# Patient Record
Sex: Male | Born: 1960 | Hispanic: Yes | Marital: Married | Smoking: Never smoker
Health system: Southern US, Community
[De-identification: ages and names within clinical notes are randomized; demographics above are authoritative.]

## PROBLEM LIST (undated history)

## (undated) DIAGNOSIS — S06305A Unspecified focal traumatic brain injury with loss of consciousness greater than 24 hours with return to pre-existing conscious level, initial encounter: Secondary | ICD-10-CM

## (undated) DIAGNOSIS — I10 Essential (primary) hypertension: Secondary | ICD-10-CM

---

## 2016-05-31 DIAGNOSIS — Z79899 Other long term (current) drug therapy: Secondary | ICD-10-CM | POA: Diagnosis not present

## 2016-05-31 DIAGNOSIS — S06369D Traumatic hemorrhage of cerebrum, unspecified, with loss of consciousness of unspecified duration, subsequent encounter: Secondary | ICD-10-CM | POA: Diagnosis not present

## 2016-05-31 DIAGNOSIS — G08 Intracranial and intraspinal phlebitis and thrombophlebitis: Secondary | ICD-10-CM | POA: Diagnosis not present

## 2016-05-31 DIAGNOSIS — S020XXD Fracture of vault of skull, subsequent encounter for fracture with routine healing: Secondary | ICD-10-CM | POA: Insufficient documentation

## 2016-05-31 DIAGNOSIS — W1800XD Striking against unspecified object with subsequent fall, subsequent encounter: Secondary | ICD-10-CM | POA: Diagnosis not present

## 2016-05-31 DIAGNOSIS — S0990XD Unspecified injury of head, subsequent encounter: Secondary | ICD-10-CM | POA: Diagnosis present

## 2016-05-31 DIAGNOSIS — I10 Essential (primary) hypertension: Secondary | ICD-10-CM | POA: Insufficient documentation

## 2016-06-01 ENCOUNTER — Emergency Department (HOSPITAL_BASED_OUTPATIENT_CLINIC_OR_DEPARTMENT_OTHER): Payer: Worker's Compensation

## 2016-06-01 ENCOUNTER — Encounter (HOSPITAL_BASED_OUTPATIENT_CLINIC_OR_DEPARTMENT_OTHER): Payer: Self-pay | Admitting: Emergency Medicine

## 2016-06-01 ENCOUNTER — Emergency Department (HOSPITAL_BASED_OUTPATIENT_CLINIC_OR_DEPARTMENT_OTHER)
Admission: EM | Admit: 2016-06-01 | Discharge: 2016-06-01 | Disposition: A | Discharge status: Other Acute Inpatient Hospital | Payer: Worker's Compensation | Attending: Emergency Medicine | Admitting: Emergency Medicine

## 2016-06-01 DIAGNOSIS — S06369D Traumatic hemorrhage of cerebrum, unspecified, with loss of consciousness of unspecified duration, subsequent encounter: Secondary | ICD-10-CM | POA: Diagnosis not present

## 2016-06-01 DIAGNOSIS — G08 Intracranial and intraspinal phlebitis and thrombophlebitis: Secondary | ICD-10-CM

## 2016-06-01 DIAGNOSIS — S020XXD Fracture of vault of skull, subsequent encounter for fracture with routine healing: Secondary | ICD-10-CM

## 2016-06-01 DIAGNOSIS — Z79899 Other long term (current) drug therapy: Secondary | ICD-10-CM | POA: Diagnosis not present

## 2016-06-01 DIAGNOSIS — W1800XD Striking against unspecified object with subsequent fall, subsequent encounter: Secondary | ICD-10-CM | POA: Diagnosis not present

## 2016-06-01 DIAGNOSIS — I619 Nontraumatic intracerebral hemorrhage, unspecified: Secondary | ICD-10-CM

## 2016-06-01 DIAGNOSIS — S0990XD Unspecified injury of head, subsequent encounter: Secondary | ICD-10-CM | POA: Diagnosis present

## 2016-06-01 DIAGNOSIS — S06309D Unspecified focal traumatic brain injury with loss of consciousness of unspecified duration, subsequent encounter: Secondary | ICD-10-CM

## 2016-06-01 DIAGNOSIS — I10 Essential (primary) hypertension: Secondary | ICD-10-CM | POA: Diagnosis not present

## 2016-06-01 HISTORY — DX: Essential (primary) hypertension: I10

## 2016-06-01 HISTORY — DX: Unspecified focal traumatic brain injury with loss of consciousness greater than 24 hours with return to pre-existing conscious level, initial encounter: S06.305A

## 2016-06-01 LAB — CBC WITH DIFFERENTIAL/PLATELET
BASOS ABS: 0 10*3/uL (ref 0.0–0.1)
BASOS PCT: 0 %
EOS ABS: 0.1 10*3/uL (ref 0.0–0.7)
Eosinophils Relative: 0 %
HCT: 42.3 % (ref 39.0–52.0)
HEMOGLOBIN: 14.9 g/dL (ref 13.0–17.0)
LYMPHS ABS: 2.4 10*3/uL (ref 0.7–4.0)
Lymphocytes Relative: 19 %
MCH: 30.2 pg (ref 26.0–34.0)
MCHC: 35.2 g/dL (ref 30.0–36.0)
MCV: 85.8 fL (ref 78.0–100.0)
Monocytes Absolute: 1.5 10*3/uL — ABNORMAL HIGH (ref 0.1–1.0)
Monocytes Relative: 12 %
NEUTROS PCT: 69 %
Neutro Abs: 8.5 10*3/uL — ABNORMAL HIGH (ref 1.7–7.7)
Platelets: 177 10*3/uL (ref 150–400)
RBC: 4.93 MIL/uL (ref 4.22–5.81)
RDW: 12.5 % (ref 11.5–15.5)
WBC: 12.4 10*3/uL — AB (ref 4.0–10.5)

## 2016-06-01 LAB — BASIC METABOLIC PANEL
Anion gap: 8 (ref 5–15)
BUN: 17 mg/dL (ref 6–20)
CO2: 27 mmol/L (ref 22–32)
CREATININE: 0.8 mg/dL (ref 0.61–1.24)
Calcium: 8.8 mg/dL — ABNORMAL LOW (ref 8.9–10.3)
Chloride: 99 mmol/L — ABNORMAL LOW (ref 101–111)
GFR calc non Af Amer: >60 mL/min (ref >60)
Glucose, Bld: 134 mg/dL — ABNORMAL HIGH (ref 65–99)
Potassium: 3.1 mmol/L — ABNORMAL LOW (ref 3.5–5.1)
SODIUM: 134 mmol/L — AB (ref 135–145)

## 2016-06-01 MED ORDER — ACETAMINOPHEN 325 MG PO TABS
650.0000 mg | ORAL_TABLET | Freq: Once | ORAL | Status: AC
  Administered 2016-06-01: 650 mg via ORAL
  Filled 2016-06-01: qty 2

## 2016-06-01 MED ORDER — FENTANYL CITRATE (PF) 100 MCG/2ML IJ SOLN
100.0000 ug | Freq: Once | INTRAMUSCULAR | Status: AC
  Administered 2016-06-01: 100 ug via INTRAVENOUS
  Filled 2016-06-01: qty 2

## 2016-06-01 MED ORDER — IOPAMIDOL (ISOVUE-300) INJECTION 61%
100.0000 mL | Freq: Once | INTRAVENOUS | Status: AC | PRN
  Administered 2016-06-01: 100 mL via INTRAVENOUS

## 2016-06-01 MED ORDER — ONDANSETRON HCL 4 MG/2ML IJ SOLN
4.0000 mg | Freq: Once | INTRAMUSCULAR | Status: AC
  Administered 2016-06-01: 4 mg via INTRAVENOUS
  Filled 2016-06-01: qty 2

## 2016-06-01 NOTE — ED Notes (Signed)
Pt presents to ED with complaints of new HA. Pt was at [facility] and discharged yesterday with skull fracture and head bleed. Pt complaints of HA unrelieved by Fioricet.

## 2016-06-01 NOTE — ED Notes (Signed)
Patient transported to CT again. 

## 2016-06-01 NOTE — ED Notes (Signed)
EDP into room 

## 2016-06-01 NOTE — ED Notes (Signed)
Pt assisted to stand at bedside to urinate.  

## 2016-06-01 NOTE — ED Provider Notes (Signed)
[facility] Provider Note: Lowella Dell, MD, FACEP  CSN: [id number] MRN: [id number] ARRIVAL: 05/31/16 at 2353 ROOM: MH03/MH03  By signing my name below, I, Levon Hedger, attest that this documentation has been prepared under the direction and in the presence of Paula Libra, MD . Electronically Signed: Levon Hedger, Scribe. 06/01/2016. 12:56 AM.   CHIEF COMPLAINT  Headache   HISTORY OF PRESENT ILLNESS  Frank Valdez is a 56 y.o. male  who presents to the Emergency Department complaining of progressively worsening, sharp frontal headache onset about two hours ago. Pt fell six days ago, striking the back of his head. He was seen at [facility] and was found to have a skull fracture with  Subarachnoid hemorrhage and subdural hematoma. Subsequent CT showed clearing of the intercranial hemorrhages. As of two days ago, he still had remaining blood in his head. When he was released two days ago, pt was still complaining of headaches. He was given Fiorcet tablets which he has taken with no relief. He was also d/c home on Keppra for seizure prophylaxis; pt's son denies any seizure activity. No alleviating or modifying factors noted. Pt's son also reports unchanged lethargy. He denies any nausea, vomiting, or confusion.   Past Medical History:  Diagnosis Date  . Bleeding in head following injury with prolonged (more than 24 hours) loss of consciousness with return to pre-existing conscious level (HCC)   . Hypertension     History reviewed. No pertinent surgical history.  History reviewed. No pertinent family history.  Social History  Substance Use Topics  . Smoking status: Never Smoker  . Smokeless tobacco: Never Used  . Alcohol use No    Prior to Admission medications   Medication Sig Start Date End Date Taking? Authorizing Provider  butalbital-acetaminophen-caffeine (FIORICET, ESGIC) 50-325-40 MG tablet Take by mouth 2 (two) times daily as needed for headache.   Yes  Historical Provider, MD  hydrochlorothiazide (MICROZIDE) 12.5 MG capsule Take 12.5 mg by mouth daily.   Yes Historical Provider, MD  levETIRAcetam (KEPPRA) 500 MG tablet Take 500 mg by mouth 2 (two) times daily.   Yes Historical Provider, MD  lisinopril (PRINIVIL,ZESTRIL) 10 MG tablet Take 10 mg by mouth daily.   Yes Historical Provider, MD    Allergies Patient has no known allergies.   REVIEW OF SYSTEMS  Negative except as noted here or in the History of Present Illness.   PHYSICAL EXAMINATION  Initial Vital Signs Blood pressure 138/74, pulse (!) 52, temperature 99 F (37.2 C), temperature source Oral, resp. rate 22, height 5\' 6"  (1.676 m), weight 171 lb (77.6 kg), SpO2 97 %.  Examination General: Well-developed, well-nourished male in no acute distress; appearance consistent with age of record HENT: normocephalic; atraumatic Eyes: pupils equal, round and reactive to light; extraocular muscles intact Neck: supple Heart: regular rate and rhythm; no murmurs, rubs or gallops Lungs: clear to auscultation bilaterally Abdomen: soft; nondistended; nontender; no masses or hepatosplenomegaly; bowel sounds present Extremities: No deformity; full range of motion; pulses normal Neurologic: Awake, alert and oriented; motor function intact in all extremities and symmetric; no facial droop; mild right pronator drift; normal finger to nose; oriented x4 Skin: Warm and dry Psychiatric: Normal mood and affect   RESULTS  Summary of this visit's results, reviewed by myself:   EKG Interpretation  Date/Time:  Monday June 01 2016 00:07:15 EST Ventricular Rate:  55 PR Interval:  158 QRS Duration: 92 QT Interval:  422 QTC Calculation: 403 R Axis:  74 Text Interpretation:  Sinus bradycardia with sinus arrhythmia Otherwise normal ECG No previous ECGs available Confirmed by Jaxtin Raimondo  MD, Jonny RuizJOHN ([id number]) on 06/01/2016 12:11:35 AM      Laboratory Studies: Results for orders placed or performed  during the hospital encounter of 06/01/16 (from the past 24 hour(s))  Basic metabolic panel     Status: Abnormal   Collection Time: 06/01/16 12:18 AM  Result Value Ref Range   Sodium 134 (L) 135 - 145 mmol/L   Potassium 3.1 (L) 3.5 - 5.1 mmol/L   Chloride 99 (L) 101 - 111 mmol/L   CO2 27 22 - 32 mmol/L   Glucose, Bld 134 (H) 65 - 99 mg/dL   BUN 17 6 - 20 mg/dL   Creatinine, Ser 6.040.80 0.61 - 1.24 mg/dL   Calcium 8.8 (L) 8.9 - 10.3 mg/dL   GFR calc non Af Amer >60 >60 mL/min   GFR calc Af Amer >60 >60 mL/min   Anion gap 8 5 - 15  CBC with Differential/Platelet     Status: Abnormal   Collection Time: 06/01/16 12:18 AM  Result Value Ref Range   WBC 12.4 (H) 4.0 - 10.5 K/uL   RBC 4.93 4.22 - 5.81 MIL/uL   Hemoglobin 14.9 13.0 - 17.0 g/dL   HCT 54.042.3 98.139.0 - 19.152.0 %   MCV 85.8 78.0 - 100.0 fL   MCH 30.2 26.0 - 34.0 pg   MCHC 35.2 30.0 - 36.0 g/dL   RDW 47.812.5 29.511.5 - 62.115.5 %   Platelets 177 150 - 400 K/uL   Neutrophils Relative % 69 %   Neutro Abs 8.5 (H) 1.7 - 7.7 K/uL   Lymphocytes Relative 19 %   Lymphs Abs 2.4 0.7 - 4.0 K/uL   Monocytes Relative 12 %   Monocytes Absolute 1.5 (H) 0.1 - 1.0 K/uL   Eosinophils Relative 0 %   Eosinophils Absolute 0.1 0.0 - 0.7 K/uL   Basophils Relative 0 %   Basophils Absolute 0.0 0.0 - 0.1 K/uL   Imaging Studies: Ct Head Wo Contrast  Result Date: 06/01/2016 CLINICAL DATA:  56 y/o M; intracranial hemorrhage for follow-up. New headaches. EXAM: CT HEAD WITHOUT CONTRAST TECHNIQUE: Contiguous axial images were obtained from the base of the skull through the vertex without intravenous contrast. COMPARISON:  05/30/2016 and 05/26/2016 CT of head. FINDINGS: Brain: Stable hemorrhagic contusions of the frontal lobes. All volume of subarachnoid hemorrhage over the frontal lobes is stable and there is new subarachnoid hemorrhage in the parietal areas that is probably due to redistribution of blood products. Stable small volume of extra-axial hemorrhage along the falx  and tentorium cerebelli bilaterally. No evidence for new acute brain parenchymal hemorrhage, large territory infarct, or significant mass effect. Vascular: Decreased attenuation of the superior sagittal sinus and question increased attenuation in the right transverse sinus when compared with 05/26/2016 CT of head. Skull: Stable nondisplaced skull fracture traversing the frontal bone anteriorly and with minimal diastases of sagittal suture. Sinuses/Orbits: No acute finding. Other: None. IMPRESSION: 1. Decreased attenuation of the superior sagittal sinus and question increased attenuation of the right transverse sinus, CT or MR venogram recommended to evaluate for thrombosis. 2. Stable bifrontal hemorrhagic contusions, subarachnoid hemorrhage, and hemorrhage along the falx/tentorium cerebelli. 3. New small volume of subarachnoid hemorrhage over the parietal lobes, probably due to redistribution of blood products. 4. No evidence for new large brain parenchymal infarct, hemorrhage, or focal mass effect. 5. Stable nondisplaced skull fracture. These results were called by telephone at the  time of interpretation on 06/01/2016 at 12:51 am to Dr. Paula Libra , who verbally acknowledged these results. Electronically Signed   By: Mitzi Hansen M.D.   On: 06/01/2016 00:54   Ct Venogram Head  Result Date: 06/01/2016 CLINICAL DATA:  56 y/o M; history of fall with intracranial hemorrhage presenting with severe frontal headache after discharge 2 days ago. EXAM: CT VENOGRAM HEAD TECHNIQUE: CT venogram of the head was performed with scanning from skullbase to vertex after intravenous contrast administration. CONTRAST:  ISOVUE-300 IOPAMIDOL (ISOVUE-300) INJECTION 61% COMPARISON:  06/01/2016 CT head appear FINDINGS: Small nonocclusive filling defect within the posterior aspect of the superior sagittal sinus subjacent to skull fracture (Series 10, image 70) probably representing a small thrombosis. Filling defect  within the right transverse and right sigmoid sinus with minimal peripheral enhancement compatible with a occlusive/near occlusive thrombus. Otherwise the superior sagittal sinus, left transverse sinus to upper internal jugular vein, straight sinus, internal cerebral veins, basal veins of Rosenthal, and large cortical veins are patent. Frontal hemorrhagic cortical contusions, subarachnoid hemorrhage, and hemorrhage along the falx and tentorium cerebelli is stable. IMPRESSION: 1. Right transverse and right sigmoid sinus occlusive/near occlusive thrombosis. 2. Small filling defect within the posterior aspect of superior sagittal sinus subjacent to skull fracture, probably a nonocclusive thrombus. 3. Frontal hemorrhagic cortical contusions, subarachnoid hemorrhage, and hemorrhage along the falx and tentorium cerebelli is stable. These results were called by telephone at the time of interpretation on 06/01/2016 at 1:57 am to Dr. Paula Libra , who verbally acknowledged these results. Electronically Signed   By: Mitzi Hansen M.D.   On: 06/01/2016 01:59    ED COURSE  Nursing notes and initial vitals signs, including pulse oximetry, reviewed.  Vitals:   06/01/16 0230 06/01/16 0245 06/01/16 0300 06/01/16 0315  BP: 118/76 130/71 115/62 131/79  Pulse: (!) 48 (!) 46 (!) 50 (!) 43  Resp: 13 11 13 13   Temp:      TempSrc:      SpO2: 96% 99% 97% 99%  Weight:      Height:       3:22 AM Patient neurologically stable. He and family advised of CT findings. Patient was discussed with Dr. Newell Coral of neurosurgery who recommends that the patient be transferred to an academic center due to the complexity of his condition. Dr. Leanord Asal of neurosurgery at [facility] was contacted and he agreed that they were capable of treating the patient appropriately. Dr. Wilson Singer, EDP, accepts for transfer to the ED.  PROCEDURES   CRITICAL CARE Performed by: Paula Libra L Total critical care time: 40  minutes Critical care time was exclusive of separately billable procedures and treating other patients. Critical care was necessary to treat or prevent imminent or life-threatening deterioration. Critical care was time spent personally by me on the following activities: development of treatment plan with patient and/or surrogate as well as nursing, discussions with consultants, evaluation of patient's response to treatment, examination of patient, obtaining history from patient or surrogate, ordering and performing treatments and interventions, ordering and review of laboratory studies, ordering and review of radiographic studies, pulse oximetry and re-evaluation of patient's condition.   ED DIAGNOSES     ICD-9-CM ICD-10-CM   1. 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 ICH (intracerebral hemorrhage) (HCC) 431 I61.9 CT VENOGRAM HEAD     CT VENOGRAM HEAD  3. Intracranial hemorrhage following injury, with loss of consciousness, subsequent encounter V58.89 S06.309D    853.06    4. Closed  fracture of frontal bone with routine healing, subsequent encounter V54.19 S02.0XXD      I personally performed the services described in this documentation, which was scribed in my presence. The recorded information has been reviewed and is accurate.    Paula Libra, MD 06/01/16 [phone]

## 2016-06-01 NOTE — ED Triage Notes (Signed)
Patient fell on Tues at about 5 pm. The patient was scanned and told that he has bleed and skull fracture. The patient reports that he was d/c'd yesterday with seizure medications. Patient is now having new headaches that were not apparent while he was at [facility],

## 2016-06-01 NOTE — ED Notes (Signed)
Back from CT

## 2016-06-01 NOTE — ED Notes (Signed)
Pt sleeping, arousable to voice, alert, NAD, calm, interactive, resps e/u, speaking in clear complete sentences, no dyspnea noted, skin W&D, VSS, (denies: pain, sob, nausea, dizziness or visual changes). [facility]

## 2016-06-01 NOTE — ED Notes (Signed)
PT returned form CT.

## 2016-06-01 NOTE — ED Notes (Signed)
Patient transported to CT 

## 2016-06-16 ENCOUNTER — Ambulatory Visit: Payer: Worker's Compensation | Attending: Psychiatry | Admitting: Occupational Therapy

## 2016-06-16 ENCOUNTER — Ambulatory Visit: Payer: Worker's Compensation | Admitting: *Deleted

## 2016-06-16 ENCOUNTER — Encounter: Payer: Self-pay | Admitting: Occupational Therapy

## 2016-06-16 ENCOUNTER — Encounter: Payer: Self-pay | Admitting: *Deleted

## 2016-06-16 DIAGNOSIS — R41841 Cognitive communication deficit: Secondary | ICD-10-CM | POA: Diagnosis present

## 2016-06-16 DIAGNOSIS — I69018 Other symptoms and signs involving cognitive functions following nontraumatic subarachnoid hemorrhage: Secondary | ICD-10-CM | POA: Diagnosis present

## 2016-06-16 DIAGNOSIS — M6281 Muscle weakness (generalized): Secondary | ICD-10-CM | POA: Insufficient documentation

## 2016-06-16 NOTE — Therapy (Signed)
[facility] [address] [place], [place], [address] Phone: [phone]   Fax:  [phone]  Speech Language Pathology Evaluation  Patient Details  Name: Frank Valdez MRN: [id number] Date of Birth: [birth year] Referring Provider: Dr. Darrin NipperGiles Crowell  Encounter Date: 06/16/2016      End of Session - 06/16/16 1252    Visit Number 1   Number of Visits 6   Date for SLP Re-Evaluation 07/24/16   SLP Start Time 1025   SLP Stop Time  1105   SLP Time Calculation (min) 40 min   Activity Tolerance Patient tolerated treatment well      Past Medical History:  Diagnosis Date  . Bleeding in head following injury with prolonged (more than 24 hours) loss of consciousness with return to pre-existing conscious level (HCC)   . Hypertension     No past surgical history on file.  There were no vitals filed for this visit.      Subjective Assessment - 06/16/16 1028    Subjective Pt reports difficulty concentrating, and difficulty with calculations during OT assessment   Patient is accompained by: Family member  Daughter Corrie DandyMary   Currently in Pain? Yes   Pain Score 4    Pain Location Head   Pain Orientation Anterior   Pain Descriptors / Indicators Headache   Pain Type Acute pain   Pain Onset 1 to 4 weeks ago   Aggravating Factors  nothing   Pain Relieving Factors medication   Effect of Pain on Daily Activities haven't been doing much            SLP Evaluation [facility] - 06/16/16 1028      SLP Visit Information   SLP Received On 06/16/16   Referring Provider Dr. Darrin NipperGiles Crowell   Onset Date 05/26/16   Medical Diagnosis ICH, SDH, left frontal skull fracture     Subjective   Subjective Pt seen in ST office for evaluation. Daughter present   Patient/Family Stated Goal return to work     General Information   HPI 56 year old male referred for outpatient ST evaluation following a fall at work. Pt sustained ICH, SDH, and left  frontal skull fracture. See above for additional PMH   Behavioral/Cognition Able to participate in evaluation without difficulty   Mobility Status ambulates independently     Prior Functional Status   Cognitive/Linguistic Baseline Within functional limits    Lives With Spouse;Son;Daughter   Available Support Family   Education 10th grade, in [place]     Cognition   Overall Cognitive Status Impaired/Different from baseline   Area of Impairment Memory;Safety/judgement;Problem solving  executive functions     Auditory Comprehension   Overall Auditory Comprehension Appears within functional limits for tasks assessed     Expression   Primary Mode of Expression Verbal     Verbal Expression   Overall Verbal Expression Appears within functional limits for tasks assessed     Oral Motor/Sensory Function   Overall Oral Motor/Sensory Function Appears within functional limits for tasks assessed     Motor Speech   Overall Motor Speech Appears within functional limits for tasks assessed     Standardized Assessments   Standardized Assessments  Cognitive Linguistic Quick Test     Cognitive Linguistic Quick Test (Ages 18-69)   Attention WNL   Memory WNL   Executive Function Mild   Language WNL   Visuospatial Skills WNL   Severity Rating Total 19   Composite Severity Rating 15.8  SLP Education - 06/16/16 1251    Education provided Yes   Education Details Test results, recommendation for therapy addressing high level cognitive skills/executive functions   Person(s) Educated Patient;Child(ren)   Methods Explanation   Comprehension Verbalized understanding          SLP Short Term Goals - 06/16/16 1258      SLP SHORT TERM GOAL #1   Time 3   Period Weeks   Status New     SLP SHORT TERM GOAL #2   Title Pt will identify and implement compensatory strategy for functional recall with mod cues for use outside of therapy   Time 3   Period  Weeks   Status New          SLP Long Term Goals - 06/16/16 1259      SLP LONG TERM GOAL #2   Title Pt will identify and implement compensatory strategy for functional recall with independent use outside of therapy   Time 6   Period Weeks   Status New          Plan - 06/16/16 1253    Clinical Impression Statement Results of the Cognitive Linguistic Quick Test (CLQT) indicate scores within functional limits on Attention, Memory, Language, and Visuospatial Skills subtests. Mild impairment noted on executive functions. Pt reports some difficulty with concentration. Given level of independence prior to injury, skilled ST intervention is recommended 1x/week for 6 weeks to address executive functions (thought organization, problem solving, reasoning) for return to work and independent lifestyle.    Speech Therapy Frequency 1x /week   Duration --  6 weeks   Treatment/Interventions SLP instruction and feedback;Compensatory strategies;Functional tasks;Cognitive reorganization;Compensatory techniques;Multimodal communcation approach;Patient/family education   Potential to Achieve Goals Good   Potential Considerations Ability to learn/carryover information;Family/community support;Previous level of function;Cooperation/participation level   Consulted and Agree with Plan of Care Patient;Family member/caregiver   Family Member Consulted daughter Corrie Dandy      Patient will benefit from skilled therapeutic intervention in order to improve the following deficits and impairments:   Cognitive communication deficit    Problem List There are no active problems to display for this patient.  Frank Valdez, MSP, CCC-SLP  Leigh Aurora 06/16/2016, 1:01 PM  [facility] [address] [place], [place], [address] Phone: [phone]   Fax:  [phone]  Name: Frank Valdez MRN: [id number] Date of Birth: 07-05-1960

## 2016-06-16 NOTE — Therapy (Signed)
[facility] [address] [place], [place], [address] Phone: [phone]   Fax:  [phone]  Occupational Therapy Evaluation  Patient Details  Name: Frank Valdez MRN: [id number] Date of Birth: May 20, 1960 Referring Provider: Dr. Darrin Nipper  Encounter Date: 06/16/2016      OT End of Session - 06/16/16 1304    Visit Number 1   Number of Visits 9   Date for OT Re-Evaluation 08/14/16   Authorization Type WC - Case manager Gretchen Portela (fax # [phone]) - only approved 6 visits, but requesting 8 OT visits   Authorization Time Period 06/17/16 - 08/14/16   Authorization - Visit Number 1   Authorization - Number of Visits 6   OT Start Time 0932   OT Stop Time 1018   OT Time Calculation (min) 46 min   Activity Tolerance Patient tolerated treatment well      Past Medical History:  Diagnosis Date  . Bleeding in head following injury with prolonged (more than 24 hours) loss of consciousness with return to pre-existing conscious level (HCC)   . Hypertension     No past surgical history on file.  There were no vitals filed for this visit.      Subjective Assessment - 06/16/16 0941    Subjective  I have decreased hearing Rt ear and incr. dizziness since fall   Patient is accompained by: Family member  daughter   Pertinent History HTN   Currently in Pain? Yes   Pain Score 4    Pain Location Head   Pain Orientation Anterior   Pain Descriptors / Indicators Headache   Pain Type Acute pain   Pain Onset 1 to 4 weeks ago   Pain Frequency Constant   Aggravating Factors  Nothing   Pain Relieving Factors pain meds           [facility] OT Assessment - 06/16/16 0001      Assessment   Diagnosis SDH   Referring Provider Dr. Darrin Nipper   Onset Date 05/26/16   Assessment Pt with good awareness. Pt reports dizziness and room spinning with head movement, particularly with laying down     Precautions   Precaution  Comments no heavy lifting, no driving     Restrictions   Weight Bearing Restrictions No     Balance Screen   Has the patient fallen in the past 6 months Yes   How many times? 1   Has the patient had a decrease in activity level because of a fear of falling?  Yes  d/t dizziness - requesting PT referral   Is the patient reluctant to leave their home because of a fear of falling?  No     Home  Environment   Additional Comments Pt lives in 2 story home.    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, troubleshooting machines     ADL   Eating/Feeding Independent   Grooming Independent   Upper Body Bathing Modified independent  Pt can get dizzy   Lower Body Bathing Modified independent   Upper Body Dressing Independent   Lower Body Dressing Independent   Toilet Tranfer Independent   Toileting - Clothing Manipulation Independent   Toileting -  Hygiene Independent   Tub/Shower Transfer Modified independent  pt hesistant d/t fear of falling from dizziness  IADL   Shopping Needs to be accompanied on any shopping trip   Light Housekeeping Does not participate in any housekeeping tasks  but did yardwork, taking out trash prior to SDH   Meal Prep --  Wife mostly did prior to SDH   Community Mobility Relies on family or friends for transportation  Pt on restrictions now, pt drove prior to SDH   Medication Management Has difficulty remembering to take medication   Financial Management Dependent  Since SDH     Mobility   Mobility Status Independent     Written Expression   Dominant Hand Right   Handwriting 100% legible;Increased time     Vision - History   Baseline Vision Bifocals  mostly for reading   Additional Comments Pt reports bluriness is getting better since SDH but reports no diplopia     Vision Assessment   Ocular Range of Motion  Within Functional Limits   Tracking/Visual Pursuits Able to track stimulus in all quads without difficulty   Convergence Within functional limits     Cognition   Overall Cognitive Status Cognition to be further assessed in functional context PRN  see speech eval for high level executive functioning deficit     Observation/Other Assessments   Observations Subtracts by 7's with mod difficulty, extra time and min errors (2 errors, 6/8 correct). Spells world backwards 100% accuracy, delayed recall 3/3     Sensation   Additional Comments Pt reports some numbness LUE     Coordination   9 Hole Peg Test Right;Left   Right 9 Hole Peg Test 23.81 sec   Left 9 Hole Peg Test 26.00 sec     Edema   Edema none     ROM / Strength   AROM / PROM / Strength AROM;Strength     AROM   Overall AROM Comments BUE AROM WNL's     Strength   Overall Strength Comments BUE MMT grossly 4+/5 with decr. overall endurance     Hand Function   Right Hand Grip (lbs) 90   Left Hand Grip (lbs) 85                              OT Long Term Goals - 06/16/16 1500      OT LONG TERM GOAL #1   Title Independent with HEP for UE strength/endurance (due 08/14/16)    Time 8   Period Weeks   Status New     OT LONG TERM GOAL #2   Title Pt to verbalize understanding of memory compensatory strategies for medication management and financial management   Time 8   Period Weeks   Status New     OT LONG TERM GOAL #3   Title Pt to return to simple IADL tasks including: taking out trash, yardwork   Time 8   Period Weeks   Status New     OT LONG TERM GOAL #4   Title Pt to demo divided attention b/t environmental scanning and physical tasks @ 90% accuracy or greater in prep for driving   Time 8   Period Weeks   Status New     OT LONG TERM GOAL #5   Title Pt to lift 25 lbs BUE's from floor to waist height and 10 lbs overhead in prep for return to work (get MD clearance first)    Time 8   Period  Weeks   Status New  Plan - 06/16/16 1315    Clinical Impression Statement Pt is a 56 y.o. male who presents to outpatient O.T. s/p subdural hemorrhage (SDH) from fall at work on 05/26/16. Pt presents today with decr. overall strength and endurance, decr. executive functioning and memory and would benefit from O.T. to address these deficits to return to IADL tasks and work related tasks   Rehab Potential Good   OT Frequency 1x / week   OT Duration 8 weeks  plus evaluation - requesting 9 visits total   OT Treatment/Interventions Self-care/ADL training;DME and/or AE instruction;Patient/family education;Therapeutic exercises;Therapeutic activities;Neuromuscular education;Functional Mobility Training;Passive range of motion;Cognitive remediation/compensation;Manual Therapy;Energy conservation;Visual/perceptual remediation/compensation   Plan memory strategies, HEP for UE strength/endurance, also sent request to case manager for 8 visits and request to MD for P.T. Services - check status of both   Recommended Other Services P.T. services to address vertigo    Consulted and Agree with Plan of Care Patient      Patient will benefit from skilled therapeutic intervention in order to improve the following deficits and impairments:  Decreased endurance, Decreased balance, Decreased knowledge of use of DME, Impaired UE functional use, Decreased mobility, Decreased cognition, Decreased strength, Decreased coordination, Decreased knowledge of precautions, Impaired vision/preception  Visit Diagnosis: Muscle weakness (generalized) - Plan: Ot plan of care cert/re-cert  Other symptoms and signs involving cognitive functions following nontraumatic subarachnoid hemorrhage - Plan: Ot plan of care cert/re-cert    Problem List There are no active problems to display for this patient.   Kelli Churn, OTR/L 06/16/2016, 3:09 PM  [place] [address] [place], [place], [address] Phone: [phone]   Fax:  [phone]  Name: Stefen Juba MRN: [id number] Date of Birth: 10/10/1960

## 2016-06-25 ENCOUNTER — Ambulatory Visit: Payer: Worker's Compensation | Attending: Psychiatry | Admitting: Occupational Therapy

## 2016-06-25 ENCOUNTER — Ambulatory Visit: Payer: Worker's Compensation | Attending: Psychiatry | Admitting: Speech Pathology

## 2016-06-25 ENCOUNTER — Ambulatory Visit: Payer: Worker's Compensation | Attending: Psychiatry | Admitting: Physical Therapy

## 2016-06-25 DIAGNOSIS — H8111 Benign paroxysmal vertigo, right ear: Secondary | ICD-10-CM | POA: Diagnosis present

## 2016-06-25 DIAGNOSIS — R2681 Unsteadiness on feet: Secondary | ICD-10-CM | POA: Diagnosis present

## 2016-06-25 DIAGNOSIS — I69018 Other symptoms and signs involving cognitive functions following nontraumatic subarachnoid hemorrhage: Secondary | ICD-10-CM | POA: Diagnosis not present

## 2016-06-25 DIAGNOSIS — M6281 Muscle weakness (generalized): Secondary | ICD-10-CM | POA: Diagnosis present

## 2016-06-25 DIAGNOSIS — R41841 Cognitive communication deficit: Secondary | ICD-10-CM | POA: Diagnosis present

## 2016-06-25 NOTE — Patient Instructions (Signed)
  Strengthening: Resisted Flexion   Hold tubing with _____ arm(s) at side. Pull forward and up. Move shoulder through pain-free range of motion. Repeat __10__ times per set.  Do _1-2_ sessions per day , every other day   Strengthening: Resisted Extension   Hold tubing in _____ hand(s), arm forward. Pull arm back, elbow straight. Repeat _10___ times per set. Do _1-2___ sessions per day, every other day.   Resisted Horizontal Abduction: Bilateral   Sit or stand, tubing in both hands, arms out in front. Keeping arms straight, pinch shoulder blades together and stretch arms out. Repeat _10___ times per set. Do _1-2___ sessions per day, every other day.   Elbow Flexion: Resisted   With tubing held in ______ hand(s) and other end secured under foot, curl arm up as far as possible. Repeat _10___ times per set. Do _1-2___ sessions per day, every other day.    Elbow Extension: Resisted   Sit in chair with resistive band secured at armrest (or hold with other hand) and _______ elbow bent. Straighten elbow. Repeat _10___ times per set.  Do _1-2___ sessions per day, every other day.     Memory Compensation Strategies  1. Use "WARM" strategy. W= write it down A=  associate it R=  repeat it M=  make a mental picture  2. You can keep a Glass blower/designerMemory Notebook. Use a 3-ring notebook with sections for the following:  calendar, important names and phone numbers, medications, doctors' names/phone numbers, "to do list"/reminders, and a section to journal what you did each day  3. Use a calendar to write appointments down.  4. Write yourself a schedule for the day.  This can be placed on the calendar or in a separate section of the Memory Notebook.  Keeping a regular schedule can help memory.  5. Use medication organizer with sections for each day or morning/evening pills  You may need help loading it  6. Keep a basket, or pegboard by the door.   Place items that you need to take out  with you in the basket or on the pegboard.  You may also want to include a message board for reminders.  7. Use sticky notes. Place sticky notes with reminders in a place where the task is performed.  For example:  "turn off the stove" placed by the stove, "lock the door" placed on the door at eye level, "take your medications" on the bathroom mirror or by the place where you normally take your medications  8. Use alarms/timers.  Use while cooking to remind yourself to check on food or as a reminder to take your medicine, or as a reminder to make a call, or as a reminder to perform another task, etc.  9. Use a small tape recorder to record important information and notes for yourself.

## 2016-06-25 NOTE — Therapy (Signed)
[facility] [address] [place], [place], [address] Phone: [phone]   Fax:  [phone]  Speech Language Pathology Treatment  Patient Details  Name: Frank Valdez MRN: [id number] Date of Birth: 1960/12/09 Referring Provider: Dr.   Neta MendsEncounter Date: 06/25/2016      End of Session - 06/25/16 1427    Visit Number 2   Number of Visits 6   Date for SLP Re-Evaluation 07/24/16   SLP Start Time 1233   SLP Stop Time  1315   SLP Time Calculation (min) 42 min      Past Medical History:  Diagnosis Date  . Bleeding in head following injury with prolonged (more than 24 hours) loss of consciousness with return to pre-existing conscious level (HCC)   . Hypertension     No past surgical history on file.  There were no vitals filed for this visit.             ADULT SLP TREATMENT - 06/25/16 1234      General Information   Behavior/Cognition Alert;Cooperative;Pleasant mood     Treatment Provided   Treatment provided Cognitive-Linquistic     Pain Assessment   Pain Assessment No/denies pain     Cognitive-Linquistic Treatment   Treatment focused on Cognition   Skilled Treatment Facilitated moderately complex executive function with organizing functional math problems and moderately complex reasoning problems with rare min A.  Trained pt to use timer on his phone to recall medications when he is not home.      Progression Toward Goals   Progression toward goals Progressing toward goals          SLP Education - 06/25/16 1300    Education provided Yes   Education Details cognitive actvities to do at home, energy conservation,    Person(s) Educated Patient;Child(ren)   Methods Explanation;Demonstration;Handout   Comprehension Verbalized understanding;Verbal cues required          SLP Short Term Goals - 06/25/16 1427      SLP SHORT TERM GOAL #1   Title Pt will complete Level 2 Executive Function (thought  organization, problem solving, sequencing, planning, and reasoning) tasks with 90% accuracy given mod cues   Time 6   Status On-going     SLP SHORT TERM GOAL #2   Title Pt will identify and implement compensatory strategy for functional recall with mod cues for use outside of therapy   Time 6   Period Weeks   Status On-going          SLP Long Term Goals - 06/25/16 1427      SLP LONG TERM GOAL #1   Title Pt will complete Level 3 Executive Function (thought organization, problem solving, sequencing, planning, and reasoning) tasks with 90% accuracy given min cues   Time 6   Period Weeks   Status On-going     SLP LONG TERM GOAL #2   Title Pt will identify and implement compensatory strategy for functional recall with independent use outside of therapy   Time 6   Period Weeks   Status On-going          Plan - 06/25/16 1423    Clinical Impression Statement High level reasoning, recall, organization and alternating attention facilitated today with rare min A and occasional extended time. Trained pt in strategy to recall meds. Continue skilled ST to maximize cognition for eventual /possible return to work   Speech Therapy Frequency 1x /week   Treatment/Interventions SLP instruction and feedback;Compensatory  strategies;Functional tasks;Cognitive reorganization;Compensatory techniques;Multimodal communcation approach;Patient/family education   Potential to Achieve Goals Good   Potential Considerations Ability to learn/carryover information;Family/community support;Previous level of function;Cooperation/participation level   Consulted and Agree with Plan of Care Patient;Family member/caregiver   Family Member Consulted daughter Corrie Dandy      Patient will benefit from skilled therapeutic intervention in order to improve the following deficits and impairments:   Cognitive communication deficit    Problem List There are no active problems to display for this patient.   Lourine Alberico,  Radene Journey MS, CCC-SLP 06/25/2016, 2:28 PM  [place] [facility] [address] [place], [place], [address] Phone: [phone]   Fax:  [phone]   Name: Frank Valdez MRN: [id number] Date of Birth: 27-Apr-1960

## 2016-06-25 NOTE — Therapy (Signed)
[facility] [address] [place], [place], [address] Phone: [phone]   Fax:  [phone]  Occupational Therapy Treatment  Patient Details  Name: Frank Valdez MRN: [id number] Date of Birth: March 09, 1961 Referring Provider: Dr. Darrin Nipper  Encounter Date: 06/25/2016      OT End of Session - 06/25/16 1149    Visit Number 2   Number of Visits 9   Date for OT Re-Evaluation 08/14/16   Authorization Type WC - Case manager Gretchen Portela (fax # [phone]) - only approved 6 visits, but requesting 8 OT visits   Authorization Time Period 06/17/16 - 08/14/16   Authorization - Visit Number 2   Authorization - Number of Visits 6   OT Start Time 1100   OT Stop Time 1145   OT Time Calculation (min) 45 min   Activity Tolerance Patient tolerated treatment well      Past Medical History:  Diagnosis Date  . Bleeding in head following injury with prolonged (more than 24 hours) loss of consciousness with return to pre-existing conscious level (HCC)   . Hypertension     No past surgical history on file.  There were no vitals filed for this visit.      Subjective Assessment - 06/25/16 1057    Subjective  I saw P.T. today for my dizziness   Patient is accompained by: Family member  daughter   Pertinent History SDH 05/26/16, HTN   Currently in Pain? Yes   Pain Score 2    Pain Location Head   Pain Orientation Anterior   Pain Descriptors / Indicators Headache   Pain Type Acute pain   Pain Onset 1 to 4 weeks ago   Pain Frequency Constant   Aggravating Factors  nothing   Pain Relieving Factors medication                      OT Treatments/Exercises (OP) - 06/25/16 0001      Cognitive Exercises   Other Cognitive Exercises 1 Memory compensatory strategies issued and reviewed. Therapist gave suggestions on how to implement for medication management and financial mngmt     Exercises   Exercises --  see pt  instructions for details - red theraband issued                OT Education - 06/25/16 1107    Education provided Yes   Education Details BUE theraband HEP, Memory compensatory strategies    Person(s) Educated Patient   Methods Demonstration;Explanation;Handout   Comprehension Verbalized understanding;Returned demonstration             OT Long Term Goals - 06/25/16 1150      OT LONG TERM GOAL #1   Title Independent with HEP for UE strength/endurance (due 08/14/16)    Time 8   Period Weeks   Status On-going     OT LONG TERM GOAL #2   Title Pt to verbalize understanding of memory compensatory strategies for medication management and financial management   Time 8   Period Weeks   Status On-going     OT LONG TERM GOAL #3   Title Pt to return to simple IADL tasks including: taking out trash, yardwork   Time 8   Period Weeks   Status New     OT LONG TERM GOAL #4   Title Pt to demo divided attention b/t environmental scanning and physical tasks @ 90% accuracy or greater in prep for driving  Time 8   Period Weeks   Status New     OT LONG TERM GOAL #5   Title Pt to lift 25 lbs BUE's from floor to waist height and 10 lbs overhead in prep for return to work (get MD clearance first)    Time 8   Period Weeks   Status New               Plan - 06/25/16 1150    Clinical Impression Statement Pt making progress towards goals.    Rehab Potential Good   OT Frequency 1x / week   OT Duration 8 weeks   OT Treatment/Interventions Self-care/ADL training;DME and/or AE instruction;Patient/family education;Therapeutic exercises;Therapeutic activities;Neuromuscular education;Functional Mobility Training;Passive range of motion;Cognitive remediation/compensation;Manual Therapy;Energy conservation;Visual/perceptual remediation/compensation   Plan review HEP, environmental scanning with divided attn, UBE, follow up with patient inquiring about lifting restriction with  referring MD   Consulted and Agree with Plan of Care Patient;Family member/caregiver   Family Member Consulted daughter      Patient will benefit from skilled therapeutic intervention in order to improve the following deficits and impairments:  Decreased endurance, Decreased balance, Decreased knowledge of use of DME, Impaired UE functional use, Decreased mobility, Decreased cognition, Decreased strength, Decreased coordination, Decreased knowledge of precautions, Impaired vision/preception  Visit Diagnosis: 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   Kelli ChurnBallie, [name], OTR/L 06/25/2016, 11:53 AM  [place] [facility] [address] [place], [place], [address] Phone: [phone]   Fax:  [phone]  Name: Frank Valdez MRN: [id number] Date of Birth: December 31, 1960

## 2016-06-25 NOTE — Patient Instructions (Signed)
   Cognitive Activities you can do at home:   - Solitaire  - Majong  - Scrabble  - Chess/Checkers  - Crosswords (easy level)  - Education officer, communityUno  - Card Games  - Board Games  - Connect 4  - Simon  - the Memory Game  - Dominoes  - Backgammon  On your computer, tablet or phone:  Memory Match Game App [employer] Chocolate Fix Sort it out  In high stimulation environments with lights, noise, crowds, groups may cause some fatigue or be harder for you to process  It's OK to take breaks if you notice your thinking is slower, more confused or you get tired  Listen to your body and take care of yourself  Set timer for your meds

## 2016-06-26 NOTE — Patient Instructions (Signed)

## 2016-06-26 NOTE — Therapy (Signed)
[facility] [address] [place], [place], [address] Phone: [phone]   Fax:  [phone]  Physical Therapy Evaluation  Patient Details  Name: Frank Valdez MRN: [id number] Date of Birth: 08/25/1960 Referring Provider: Darrin NipperGiles Crowell, MD  Encounter Date: 06/25/2016      PT End of Session - 06/26/16 1605    Visit Number 1   Number of Visits 6   Date for PT Re-Evaluation 07/26/16   Authorization Type Worker's Comp   Authorization - Visit Number 1   Authorization - Number of Visits 8   PT Start Time 0932   PT Stop Time 1015   PT Time Calculation (min) 43 min      Past Medical History:  Diagnosis Date  . Bleeding in head following injury with prolonged (more than 24 hours) loss of consciousness with return to pre-existing conscious level (HCC)   . Hypertension     No past surgical history on file.  There were no vitals filed for this visit.       Subjective Assessment - 06/26/16 1557    Subjective Pt reports he fell at work on 05-26-16 - reports mild headache at current time, states headaches have much improved since accident: Pt reports dizziness episodes are better at some times than others - states that he feels dizziness is worse when he doesn't sleep well; pt states dizziness was really bad on March 1 when he didnt't sleep well that night:    Patient is accompained by: Family member   Pertinent History SDH due to fall   Patient Stated Goals resolve the vertigo            [facility] - 06/26/16 0001      Assessment   Medical Diagnosis Vertigo   Referring Provider Darrin NipperGiles Crowell, MD   Onset Date/Surgical Date 05/26/16     Precautions   Precaution Comments no heavy lifting, no driving     Balance Screen   Has the patient fallen in the past 6 months Yes   How many times? 1   Has the patient had a decrease in activity level because of a fear of falling?  No   Is the patient reluctant to  leave their home because of a fear of falling?  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  Pt reports constant tinnitus in Rt ear, reports loss of smell; denies N/V     Symptom Behavior   Type of Dizziness Spinning   Frequency of Dizziness varies   Duration of Dizziness seconds to minutes   Aggravating Factors Lying supine;Rolling to right;Activity in general   Relieving Factors Rest     Occulomotor Exam   Occulomotor Alignment Normal     Positional Testing   Dix-Hallpike Dix-Hallpike Right;Dix-Hallpike Left   Sidelying Test Sidelying Right;Sidelying Left     Dix-Hallpike Right   Dix-Hallpike Right Duration approx.  15 secs   Dix-Hallpike Right Symptoms Upbeat, right rotatory nystagmus     Dix-Hallpike Left   Dix-Hallpike Left Duration none   Dix-Hallpike Left Symptoms No nystagmus     Sidelying Right   Sidelying Right Duration no nystagmus noted but pt reported feeling as if vertigo was going to start   Sidelying Right Symptoms No nystagmus     Sidelying Left   Sidelying Left Duration none    Sidelying Left Symptoms No nystagmus      Canalith Repositioning:  Performed 1 rep of Epley maneuver for Rt BPPV; pt c/o nausea after tx so repositioning tx was stopped Due to pt not feeling well   Provided information on BPPV to patient - discussed and reviewed Brandt-Daroff exercises with pt and daughter              PT Education - 06/26/16 1604    Education provided Yes   Education  Details info on BPPV etiology   Person(s) Educated Patient;Child(ren)   Methods Explanation;Demonstration;Handout   Comprehension Verbalized understanding;Returned demonstration             PT Long Term Goals - 06/26/16 1610      PT LONG TERM GOAL #1   Title Pt will have a (-) Rt Dix-Hallpike test to indicate resolution of Rt BPPV.  07-17-16   Time 3   Period Weeks   Status New     PT LONG TERM GOAL #2   Title Pt will report no vertigo with bed mobility or with transitional movements.  07-17-16   Time 3   Period Weeks   Status New     PT LONG TERM GOAL #3   Title Independent in HEP for habituation or self treatment as needed for re-occurrence of BPPV.  07-17-16   Time 3   Period Weeks   Status New               Plan - 06/26/16 1607    Clinical Impression Statement Pt is a 56 year old gentleman with signs and symptoms consistent with Rt BPPV with (+) Rt Dix-Hallpike test with rotary, upbeating nystagmus noted with severe c/o vertigo in test position.  Pt tolerated only 1 rep of Epley's maneuver due to c/o nausea.  PMH includes SDH sustained in fall on 05-26-16.     Rehab Potential Good   PT Frequency 2x / week   PT Duration 3 weeks   PT Treatment/Interventions ADLs/Self Care Home Management;Canalith Repostioning;Therapeutic activities;Therapeutic exercise;Balance training;Neuromuscular re-education;Patient/family education;Vestibular   PT Next Visit Plan Reassess Rt BPPV - treat prn with Epley's   PT Home Exercise Plan Brandt-Daroff exercises prn   Consulted and Agree with Plan of Care Patient;Family member/caregiver      Patient will benefit from skilled therapeutic intervention in order to improve the following deficits and impairments:  Dizziness, Decreased balance  Visit Diagnosis: BPPV (benign paroxysmal positional vertigo), right - Plan: PT plan of care cert/re-cert  Unsteadiness on feet - Plan: PT plan of care cert/re-cert     Problem List There are no  active problems to display for this patient.   ZOXWRU, EAVWU [id number], PT 06/26/2016, 4:18 PM  [facility] [address] [place], [place], [address] Phone: [phone]   Fax:  [phone]  Name: Frank Valdez MRN: [id number] Date of Birth: [birth year]

## 2016-06-30 ENCOUNTER — Ambulatory Visit: Payer: Worker's Compensation | Attending: Psychiatry | Admitting: Physical Therapy

## 2016-06-30 DIAGNOSIS — R2681 Unsteadiness on feet: Secondary | ICD-10-CM | POA: Insufficient documentation

## 2016-06-30 DIAGNOSIS — H8111 Benign paroxysmal vertigo, right ear: Secondary | ICD-10-CM | POA: Diagnosis present

## 2016-06-30 NOTE — Therapy (Signed)
[facility] [address] [place], [place], [address] Phone: [phone]   Fax:  [phone]  Physical Therapy Treatment  Patient Details  Name: Frank Valdez MRN: [id number] Date of Birth: November 25, 1960 Referring Provider: Darrin NipperGiles Crowell, MD  Encounter Date: 06/30/2016      PT End of Session - 07/01/16 1022    Visit Number 2   Number of Visits 6   Date for PT Re-Evaluation 07/26/16   Authorization Type Worker's Comp   Authorization - Visit Number 2   Authorization - Number of Visits 8   PT Start Time 1400   PT Stop Time 1455   PT Time Calculation (min) 55 min      Past Medical History:  Diagnosis Date  . Bleeding in head following injury with prolonged (more than 24 hours) loss of consciousness with return to pre-existing conscious level (HCC)   . Hypertension     No past surgical history on file.  There were no vitals filed for this visit.      Subjective Assessment - 07/01/16 1016    Subjective Pt states he feels vertigo is much improved, however, he has not tried to do any movements or attempt any positions that he feels may have provoked it; has not tried rolling or lying on Rt side   Patient is accompained by: Family member   Pertinent History SDH due to fall   Patient Stated Goals resolve the vertigo   Currently in Pain? No/denies         Pt reports he has had ringing (tinnitus) in his Rt ear constantly since the accident, and also has decreased sense of smell since accident; I have informed pt that these problems should not be related to the BPPV but may be result of the TBI sustained in accident; recommend possible referral To ENT if these problems persist; recommended pt to discuss this with his PCP and she may determine if referral to ENT is warranted    (Lakshmi Paruchuri - pt's PCP)     NeuroRe-ed;  (-) Rt and Lt sidelying tests with no nystagmus and no c/o vertigo in either position  Rt  Dix-Hallpike test - no nystagmus noted in test position but pt did report minimal feeling of light-headedness with return to upright Performed 1 rep of Epley maneuver for Rt BPPV to confirm no nystagmus and no c/o vertigo in any position of canalith repositioning Maneuver (there was neither nystagmus nor c/o vertigo)  Lt Dix-Hallpike test (-) with no nystagmus and no c/o vertigo  DVA test 2 line difference; static visual acuity line 8 (tested with eyeglasses on) ; DVA line 6 - WNL's  Sensory Organization Test score - 63/100 for composite;  N= 70/100  Pt had FALL on trials 1 and 2 of condition 6 with N score on trial 3  Trials 2 & 3 slightly below N on condition 3, trial 1 of condition 4 approx. 60% decr. From N, trial 2 condition 5 slightly decr. From N  Somatosensory input WNL Visual input slightly decr. With score 71/100 with N= 74/100 Vestibular input WNL  Explained results of SOT to pt and to his daughter who was present during tx session  Pt performed balance on foam exercises in corner with EO and EC with horizontal and vertical head turns to incr. Vestibular input in maintaining balance                PT Education - 07/01/16 1022  Education provided Yes   Education Details standing on foam - EO and EC with head turns   Person(s) Educated Patient   Methods Explanation;Demonstration;Handout   Comprehension Verbalized understanding;Returned demonstration             PT Long Term Goals - 06/26/16 1610      PT LONG TERM GOAL #1   Title Pt will have a (-) Rt Dix-Hallpike test to indicate resolution of Rt BPPV.  07-17-16   Time 3   Period Weeks   Status New     PT LONG TERM GOAL #2   Title Pt will report no vertigo with bed mobility or with transitional movements.  07-17-16   Time 3   Period Weeks   Status New     PT LONG TERM GOAL #3   Title Independent in HEP for habituation or self treatment as needed for re-occurrence of BPPV.  07-17-16   Time 3    Period Weeks   Status New               Plan - 07/01/16 1023    Clinical Impression Statement Pt has no signs or symptoms of Rt BPPV at this time as Rt Dix-Hallpike test is (-) with no nystagmus provoked with any positional testing and pt reported no vertigo with any positional testing.  Pt has slightly decreased visual input in maintaining balance per SOT with decreased composite score of 63/100 with N= 70/100 for his age population.  Vestibular input WNL's per SOT score.   Rehab Potential Good   PT Frequency 2x / week   PT Duration 3 weeks   PT Treatment/Interventions ADLs/Self Care Home Management;Canalith Repostioning;Therapeutic activities;Therapeutic exercise;Balance training;Neuromuscular re-education;Patient/family education;Vestibular   PT Next Visit Plan Reassess Rt BPPV - treat prn with Epley's; check HEP; Redo SOT? D/C if no vertigo persists   PT Home Exercise Plan Brandt-Daroff exercises prn   Consulted and Agree with Plan of Care Patient;Family member/caregiver      Patient will benefit from skilled therapeutic intervention in order to improve the following deficits and impairments:  Dizziness, Decreased balance  Visit Diagnosis: BPPV (benign paroxysmal positional vertigo), right  Unsteadiness on feet     Problem List There are no active problems to display for this patient.   Kary Kos, PT 07/01/2016, 10:34 AM  [facility] [address] [place], [place], [address] Phone: [phone]   Fax:  [phone]  Name: Frank Valdez MRN: [id number] Date of Birth: 16-Mar-1961

## 2016-07-01 NOTE — Patient Instructions (Signed)
Balance: Eyes Closed - Bilateral (Varied Surfaces)    Stand, feet shoulder width, close eyes. Maintain balance 30 seconds. Repeat 1 times per set. Do 1 sets per session. Do 5 sessions per week. Repeat on compliant surface: foam.            Copyright  VHI. All rights reserved.  Feet Apart (Compliant Surface) Head Motion - Eyes Closed    Stand on compliant surface: pillow with feet shoulder width apart. Close eyes and move head slowly, up and down. Repeat 10 times per session. Do 2 sessions per day.  Copyright  VHI. All rights reserved.  Feet Together (Compliant Surface) Head Motion - Eyes Closed    Stand on compliant surface:pillow with feet together. Close eyes and move head slowly, up and down. Repeat 2 times per session. Do 2 sessions per day.  Copyright  VHI. All rights reserved.

## 2016-07-02 ENCOUNTER — Ambulatory Visit: Payer: Worker's Compensation | Attending: Psychiatry | Admitting: Occupational Therapy

## 2016-07-02 ENCOUNTER — Ambulatory Visit: Payer: Worker's Compensation | Attending: Psychiatry | Admitting: Speech Pathology

## 2016-07-02 DIAGNOSIS — M6281 Muscle weakness (generalized): Secondary | ICD-10-CM | POA: Insufficient documentation

## 2016-07-02 DIAGNOSIS — R41841 Cognitive communication deficit: Secondary | ICD-10-CM | POA: Diagnosis present

## 2016-07-02 DIAGNOSIS — I69018 Other symptoms and signs involving cognitive functions following nontraumatic subarachnoid hemorrhage: Secondary | ICD-10-CM | POA: Diagnosis present

## 2016-07-02 NOTE — Therapy (Signed)
[facility] [address] [place], [place], [address] Phone: [phone]   Fax:  [phone]  Occupational Therapy Treatment  Patient Details  Name: Frank Valdez MRN: [id number] Date of Birth: 07/01/1960 Referring Provider: Dr. Clarice Pole  Encounter Date: 07/02/2016      OT End of Session - 07/02/16 1652    Visit Number 3   Number of Visits 9   Date for OT Re-Evaluation 08/14/16   Authorization Type WC - Case manager Cliffton Asters (fax # [phone]) - only approved 6 visits, but requesting 8 OT visits   Authorization Time Period 06/17/16 - 08/14/16   Authorization - Visit Number 3   Authorization - Number of Visits 6   OT Start Time 1400   OT Stop Time 1445   OT Time Calculation (min) 45 min   Activity Tolerance Patient tolerated treatment well      Past Medical History:  Diagnosis Date  . Bleeding in head following injury with prolonged (more than 24 hours) loss of consciousness with return to pre-existing conscious level ([place])   . Hypertension     No past surgical history on file.  There were no vitals filed for this visit.      Subjective Assessment - 07/02/16 1404    Patient is accompained by: Family member   Pertinent History SDH 05/26/16, HTN   Currently in Pain? Yes   Pain Score 3    Pain Location Head   Pain Orientation Anterior   Pain Descriptors / Indicators Headache   Pain Type Acute pain   Pain Onset 1 to 4 weeks ago   Pain Frequency Constant   Aggravating Factors  nothing   Pain Relieving Factors medication                      OT Treatments/Exercises (OP) - 07/02/16 0001      ADLs   ADL Comments Reviewed memory strategies with pt/daughter. Pt instructed to follow up MD re: weight lifting restrictions as pt forgot last time (pt instructed to write down for memory strategy). Pt also instructed to purchase planner/journal for memory deficits. Daughter present for  education as well. Pt also instructed to pay 3 bills online under direct family supervision to ensure accuracy, and to assess how he does     Exercises   Exercises Shoulder     Shoulder Exercises: ROM/Strengthening   UBE (Upper Arm Bike) x 10 min. Level 3 for UE strength/endurance   Other ROM/Strengthening Exercises Reviewed UE theraband HEP verbally     Visual/Perceptual Exercises   Scanning Environmental   Scanning - Environmental Scanning for items while performing physical task (tossing ball): pt id 7/12 items on 1st trial (58% accuracy), and found remaining 5 items on 2nd trial. Therapist suspects pt only missing initially d/t uncertainty of what to do.                 OT Education - 07/02/16 1651    Education provided Yes   Education Details recommendations to purchase planner for memory strategies, and to f/u with MD re: weight lifting restrictions   Person(s) Educated Patient;Child(ren)   Methods Explanation   Comprehension Verbalized understanding             OT Long Term Goals - 07/02/16 1652      OT LONG TERM GOAL #1   Title Independent with HEP for UE strength/endurance (due 08/14/16)    Time 8   Period  Weeks   Status Achieved     OT LONG TERM GOAL #2   Title Pt to verbalize understanding of memory compensatory strategies for medication management and financial management   Time 8   Period Weeks   Status Achieved     OT LONG TERM GOAL #3   Title Pt to return to simple IADL tasks including: taking out trash, yardwork   Time 8   Period Weeks   Status On-going     OT LONG TERM GOAL #4   Title Pt to demo divided attention b/t environmental scanning and physical tasks @ 90% accuracy or greater in prep for driving   Time 8   Period Weeks   Status On-going     OT LONG TERM GOAL #5   Title Pt to lift 25 lbs BUE's from floor to waist height and 10 lbs overhead in prep for return to work (get MD clearance first)    Time 8   Period Weeks   Status New                Plan - 07/02/16 1653    Clinical Impression Statement Pt met LTG #1 AND #2. Pt making progress towards remaining goals   Rehab Potential Good   OT Frequency 1x / week   OT Duration 8 weeks   OT Treatment/Interventions Self-care/ADL training;DME and/or AE instruction;Patient/family education;Therapeutic exercises;Therapeutic activities;Neuromuscular education;Functional Mobility Training;Passive range of motion;Cognitive remediation/compensation;Manual Therapy;Energy conservation;Visual/perceptual remediation/compensation   Plan continue environmental scanning with physical tasks, continue UBE. Begin work related tasks if clarification on weight lifting restrictions have been made - may consider placing on hold until after MD sees him if we do not get clarifications   Consulted and Agree with Plan of Care Patient;Family member/caregiver   Family Member Consulted daughter      Patient will benefit from skilled therapeutic intervention in order to improve the following deficits and impairments:  Decreased endurance, Decreased balance, Decreased knowledge of use of DME, Impaired UE functional use, Decreased mobility, Decreased cognition, Decreased strength, Decreased coordination, Decreased knowledge of precautions, Impaired vision/preception  Visit Diagnosis: 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   Carey Bullocks, OTR/L 07/02/2016, 4:57 PM  [facility] [address] [place], [place], [address] Phone: [phone]   Fax:  [phone]  Name: Frank Valdez MRN: [id number] Date of Birth: 31-Aug-1960

## 2016-07-02 NOTE — Therapy (Signed)
[facility] [address] [place], [place], [address] Phone: [phone]   Fax:  [phone]  Speech Language Pathology Treatment  Patient Details  Name: Frank Valdez MRN: [id number] Date of Birth: Aug 21, 1960 Referring Provider: Dr.   Neta Mends Date: 07/02/2016      End of Session - 07/02/16 1805    Visit Number 3   Number of Visits 6   Date for SLP Re-Evaluation 07/24/16   SLP Start Time 1317   SLP Stop Time  1400   SLP Time Calculation (min) 43 min   Activity Tolerance Patient tolerated treatment well      Past Medical History:  Diagnosis Date  . Bleeding in head following injury with prolonged (more than 24 hours) loss of consciousness with return to pre-existing conscious level (HCC)   . Hypertension     No past surgical history on file.  There were no vitals filed for this visit.      Subjective Assessment - 07/02/16 1320    Subjective "i guess i'm doing a little bit better but unfortunately not like i was before the accident."   Patient is accompained by: Family member               ADULT SLP TREATMENT - 07/02/16 1321      General Information   Behavior/Cognition Alert;Cooperative;Pleasant mood   Patient Positioning Upright in chair     Treatment Provided   Treatment provided Cognitive-Linquistic     Pain Assessment   Pain Assessment 0-10   Pain Score 5    Pain Location left calf   Pain Descriptors / Indicators Constant;Discomfort   Pain Intervention(s) Monitored during session     Cognitive-Linquistic Treatment   Treatment focused on Cognition   Skilled Treatment Patient states he has not been setting his timer to recall medications. States "I kind of notice I'm forgetting what I'm not supposed to forget." Demonstrated to patient how to set a recurring calendar alert on his phone for his morning medications. With min cues, he set a recurring alert for his evening medications.  Provided handout and education for calendar use to improve functional recall. Patient stated he is planning to buy a calendar; he turned to his daughter and stated, "Remind me to stop and get one." SLP prompted patient to identify a strategy other than his daughter to help him remember this goal. With min A he recorded a note to himself on the calendar worksheet provided. Instructed patient to begin recording a daily to-do list for home exercise, bring his calendar with him to therapy appointments.      Assessment / Recommendations / Plan   Plan Continue with current plan of care     Progression Toward Goals   Progression toward goals Progressing toward goals          SLP Education - 07/02/16 1804    Education provided Yes   Education Details calendar use   Person(s) Educated Patient;Child(ren)   Methods Explanation;Handout   Comprehension Verbalized understanding          SLP Short Term Goals - 07/02/16 1809      SLP SHORT TERM GOAL #1   Title Pt will complete Level 2 Executive Function (thought organization, problem solving, sequencing, planning, and reasoning) tasks with 90% accuracy given mod cues   Time 5   Period Weeks   Status On-going     SLP SHORT TERM GOAL #2   Title Pt will identify and implement compensatory  strategy for functional recall with mod cues for use outside of therapy   Time 5   Period Weeks   Status On-going          SLP Long Term Goals - 07/02/16 1809      SLP LONG TERM GOAL #1   Title Pt will complete Level 3 Executive Function (thought organization, problem solving, sequencing, planning, and reasoning) tasks with 90% accuracy given min cues   Time 5   Period Weeks   Status On-going     SLP LONG TERM GOAL #2   Title Pt will identify and implement compensatory strategy for functional recall with independent use outside of therapy   Time 5   Period Weeks   Status On-going          Plan - 07/02/16 1808    Clinical Impression Statement  Patient appears receptive and eager to use strategies, aids trained today to assist with functional recall and organization. Required min A to use strategies provided. Continue skilled ST to maximize cognition for eventual /possible return to work.   Speech Therapy Frequency 1x /week   Duration Other (comment)   Treatment/Interventions SLP instruction and feedback;Compensatory strategies;Functional tasks;Cognitive reorganization;Compensatory techniques;Multimodal communcation approach;Patient/family education   Potential to Achieve Goals Good   Potential Considerations Ability to learn/carryover information;Family/community support;Previous level of function;Cooperation/participation level   Consulted and Agree with Plan of Care Patient;Family member/caregiver   Family Member Consulted daughter Corrie DandyMary      Patient will benefit from skilled therapeutic intervention in order to improve the following deficits and impairments:   Cognitive communication deficit    Problem List There are no active problems to display for this patient.   Frank Valdez, [place] CF-SLP Speech-Language Pathologist  Frank Valdez 07/02/2016, 6:10 PM   [facility] [address] [place], [place], [address] Phone: [phone]   Fax:  [phone]   Name: Frank Valdez MRN: [id number] Date of Birth: Dec 28, 1960

## 2016-07-06 ENCOUNTER — Ambulatory Visit: Payer: Worker's Compensation | Admitting: Physical Therapy

## 2016-07-06 DIAGNOSIS — R41841 Cognitive communication deficit: Secondary | ICD-10-CM | POA: Insufficient documentation

## 2016-07-09 ENCOUNTER — Ambulatory Visit: Payer: Worker's Compensation | Attending: Psychiatry | Admitting: Occupational Therapy

## 2016-07-09 ENCOUNTER — Encounter: Payer: Self-pay | Admitting: Occupational Therapy

## 2016-07-09 ENCOUNTER — Ambulatory Visit: Payer: Worker's Compensation | Attending: Psychiatry | Admitting: Speech Pathology

## 2016-07-09 VITALS — BP 157/94

## 2016-07-09 DIAGNOSIS — I69018 Other symptoms and signs involving cognitive functions following nontraumatic subarachnoid hemorrhage: Secondary | ICD-10-CM | POA: Diagnosis not present

## 2016-07-09 DIAGNOSIS — H8112 Benign paroxysmal vertigo, left ear: Secondary | ICD-10-CM | POA: Diagnosis present

## 2016-07-09 DIAGNOSIS — M6281 Muscle weakness (generalized): Secondary | ICD-10-CM

## 2016-07-09 DIAGNOSIS — R41841 Cognitive communication deficit: Secondary | ICD-10-CM | POA: Insufficient documentation

## 2016-07-09 NOTE — Therapy (Signed)
[facility] [address] [place], [place], [address] Phone: [phone]   Fax:  [phone]  Speech Language Pathology Treatment  Patient Details  Name: Frank Valdez MRN: [id number] Date of Birth: 1960/04/26 Referring Provider: Dr.   Neta MendsEncounter Date: 07/09/2016      End of Session - 07/09/16 1344    Visit Number 4   Number of Visits 6   Date for SLP Re-Evaluation 07/24/16   SLP Start Time 1102   SLP Stop Time  1145   SLP Time Calculation (min) 43 min      Past Medical History:  Diagnosis Date  . Bleeding in head following injury with prolonged (more than 24 hours) loss of consciousness with return to pre-existing conscious level (HCC)   . Hypertension     No past surgical history on file.  There were no vitals filed for this visit.      Subjective Assessment - 07/09/16 1111    Subjective Pt demonstrated his calendar - he states "it is helpful for me"   Patient is accompained by: Family member   Special Tests daughter               ADULT SLP TREATMENT - 07/09/16 1111      General Information   Behavior/Cognition Alert;Cooperative;Pleasant mood     Treatment Provided   Treatment provided Cognitive-Linquistic     Pain Assessment   Pain Assessment 0-10   Pain Score 2    Pain Location neck   Pain Descriptors / Indicators Aching   Pain Intervention(s) Monitored during session     Cognitive-Linquistic Treatment   Treatment focused on Cognition   Skilled Treatment Pt reviewed homework with pt - he missed his meds today, but remebered that he forgot. Organization and alternating attention between  moderately complex card sort (3 piles with each different rule) and mildly complex auditory alphabetizing task with extended time and occasional repetition and cues. Moderately complex reasoning problems with functional math with extended time and usual min to mod A.     Assessment / Recommendations /  Plan   Plan Continue with current plan of care     Progression Toward Goals   Progression toward goals Progressing toward goals          SLP Education - 07/09/16 1340    Education provided Yes   Education Details continue to use calendar and timer to compensate for short term memory and attention issues   Person(s) Educated Patient;Spouse   Methods Explanation;Demonstration   Comprehension Verbalized understanding;Returned demonstration          SLP Short Term Goals - 07/09/16 1344      SLP SHORT TERM GOAL #1   Title Pt will complete Level 2 Executive Function (thought organization, problem solving, sequencing, planning, and reasoning) tasks with 90% accuracy given mod cues   Time 4   Period Weeks   Status On-going     SLP SHORT TERM GOAL #2   Title Pt will identify and implement compensatory strategy for functional recall with mod cues for use outside of therapy   Time 4   Period Weeks   Status On-going          SLP Long Term Goals - 07/09/16 1344      SLP LONG TERM GOAL #1   Title Pt will complete Level 3 Executive Function (thought organization, problem solving, sequencing, planning, and reasoning) tasks with 90% accuracy given min cues   Time 4  Period Weeks   Status On-going     SLP LONG TERM GOAL #2   Title Pt will identify and implement compensatory strategy for functional recall with independent use outside of therapy   Time 4   Period Weeks   Status On-going          Plan - 07/09/16 1341    Clinical Impression Statement Pt has filled out calendar/agenda and states that it is helping with organization and memory. Continues to miss meds times even with use of timer, as he likes to "spread them out" and not take all of them at once. Encouraged him to take his blood pressure meds with his am meds. alternating attention, attention to detail and organization tasks with extended time and rare to occasional min A. Cintinue skilled ST to maximize cognition  for possible return to work.   Speech Therapy Frequency 1x /week   Treatment/Interventions SLP instruction and feedback;Compensatory strategies;Functional tasks;Cognitive reorganization;Compensatory techniques;Multimodal communcation approach;Patient/family education   Potential to Achieve Goals Good   Consulted and Agree with Plan of Care Patient;Family member/caregiver   Family Member Consulted daughter Corrie Dandy      Patient will benefit from skilled therapeutic intervention in order to improve the following deficits and impairments:   Cognitive communication deficit    Problem List There are no active problems to display for this patient.   Ashawna Hanback, Radene Journey MS, CCC-SLP 07/09/2016, 1:45 PM  [facility] [address] [place], [place], [address] Phone: [phone]   Fax:  [phone]   Name: Frank Valdez MRN: [id number] Date of Birth: [birth year]

## 2016-07-10 NOTE — Therapy (Addendum)
[facility] [address] [place], [place], [address] Phone: [phone]   Fax:  [phone]  Occupational Therapy Treatment  Patient Details  Name: Frank Valdez MRN: [id number] Date of Birth: 1960/06/25 Referring Provider: Dr. Darrin Nipper  Encounter Date: 07/09/2016      OT End of Session - 07/09/16 1039    Visit Number 4   Number of Visits 9   Date for OT Re-Evaluation 08/14/16   Authorization Type WC - Case manager Gretchen Portela (fax # [phone]) - only approved 6 visits, but requesting 8 OT visits   Authorization Time Period 06/17/16 - 08/14/16   Authorization - Visit Number 4   Authorization - Number of Visits 6   OT Start Time 1018   OT Stop Time 1100   OT Time Calculation (min) 42 min      Past Medical History:  Diagnosis Date  . Bleeding in head following injury with prolonged (more than 24 hours) loss of consciousness with return to pre-existing conscious level (HCC)   . Hypertension     No past surgical history on file.  Vitals:   07/09/16 1026  BP: (!) 157/94          Treatment: arm bike x 6 mins level 1 for conditioning. Pt denise pain today Environmental scanning while ambulating and tossing a ball, pt located 100% of items, min v.c. To continue tossing ball while scanning. Divided attention task, ambulating while tossing ball and performing category generation min-mod v.c Pt reports he sees MD, discussed with pt plans to cx OT until pt sees MD and receives clearance for physical work related tasks. Therapist encouraged pt to perform bill pay at home with supervision.  He has not attempted yet. Pt forgot to take BP meds, therapist reinforced importance of taking meds consistently.                         OT Long Term Goals - 07/02/16 1652      OT LONG TERM GOAL #1   Title Independent with HEP for UE strength/endurance (due 08/14/16)    Time 8   Period Weeks    Status Achieved     OT LONG TERM GOAL #2   Title Pt to verbalize understanding of memory compensatory strategies for medication management and financial management   Time 8   Period Weeks   Status Achieved     OT LONG TERM GOAL #3   Title Pt to return to simple IADL tasks including: taking out trash, yardwork   Time 8   Period Weeks   Status On-going     OT LONG TERM GOAL #4   Title Pt to demo divided attention b/t environmental scanning and physical tasks @ 90% accuracy or greater in prep for driving   Time 8   Period Weeks   Status On-going     OT LONG TERM GOAL #5   Title Pt to lift 25 lbs BUE's from floor to waist height and 10 lbs overhead in prep for return to work (get MD clearance first)    Time 8   Period Weeks   Status New               Plan - 07/10/16 1616    Clinical Impression Statement Pt is progressing towards goals. He has not received clearance from his MD to begin strenghtening. Pt placed on hold until the can begin strengthening./ lifting   Rehab  Potential Good   OT Duration 8 weeks   OT Treatment/Interventions Self-care/ADL training;DME and/or AE instruction;Patient/family education;Therapeutic exercises;Therapeutic activities;Neuromuscular education;Functional Mobility Training;Passive range of motion;Cognitive remediation/compensation;Manual Therapy;Energy conservation;Visual/perceptual remediation/compensation   Plan Place pt on hold until pt sees MD for clearance of lifting/ work related tasks   Consulted and Agree with Plan of Care Patient;Family member/caregiver   Family Member Consulted daughter      Patient will benefit from skilled therapeutic intervention in order to improve the following deficits and impairments:  Decreased endurance, Decreased balance, Decreased knowledge of use of DME, Impaired UE functional use, Decreased mobility, Decreased cognition, Decreased strength, Decreased coordination, Decreased knowledge of precautions,  Impaired vision/preception  Visit Diagnosis: 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   Frank Valdez 07/10/2016, 4:18 PM  [facility] [address] [place], [place], [address] Phone: [phone]   Fax:  [phone]  Name: Frank Valdez MRN: [id number] Date of Birth: 09-07-1960

## 2016-07-13 ENCOUNTER — Encounter: Payer: Self-pay | Admitting: Physical Therapy

## 2016-07-14 ENCOUNTER — Ambulatory Visit: Payer: Worker's Compensation | Admitting: Physical Therapy

## 2016-07-14 VITALS — BP 159/102

## 2016-07-14 DIAGNOSIS — H8112 Benign paroxysmal vertigo, left ear: Secondary | ICD-10-CM

## 2016-07-14 DIAGNOSIS — I69018 Other symptoms and signs involving cognitive functions following nontraumatic subarachnoid hemorrhage: Secondary | ICD-10-CM | POA: Diagnosis not present

## 2016-07-15 ENCOUNTER — Ambulatory Visit: Payer: Worker's Compensation | Attending: Psychiatry

## 2016-07-15 ENCOUNTER — Encounter: Payer: Self-pay | Admitting: Occupational Therapy

## 2016-07-15 DIAGNOSIS — R41841 Cognitive communication deficit: Secondary | ICD-10-CM

## 2016-07-15 NOTE — Therapy (Signed)
[facility] [address] [place], [place], [address] Phone: [phone]   Fax:  [phone]  Physical Therapy Treatment  Patient Details  Name: Frank Valdez MRN: [id number] Date of Birth: March 03, 1961 Referring Provider: Darrin NipperGiles Crowell, MD  Encounter Date: 07/14/2016      PT End of Session - 07/15/16 0959    Visit Number 3   Number of Visits 6   Date for PT Re-Evaluation 07/26/16   Authorization Type Worker's Comp   Authorization - Visit Number 3   Authorization - Number of Visits 8   PT Start Time 0802   PT Stop Time 0846   PT Time Calculation (min) 44 min      Past Medical History:  Diagnosis Date  . Bleeding in head following injury with prolonged (more than 24 hours) loss of consciousness with return to pre-existing conscious level (HCC)   . Hypertension     No past surgical history on file.  Vitals:   07/14/16 0810  BP: (!) 159/102        Subjective Assessment - 07/14/16 0803    Subjective Pt reports he feels as if something is moving in his head when he lies down (face up) and feels dizzy but does not feel true vertigo with room spinning - feels something in his head; did not do alot of exercises since he was feeling that dizziness;; pt states he was doing good until Sunday March 18  Sunday the 18th was very bad - has not been that severe but has felt some dizziness every day since then   Pertinent History SDH due to fall   Patient Stated Goals resolve the vertigo   Currently in Pain? No/denies          NeuroRe-ed;  (+) Lt sidelying test with c/o vertigo and rotary upbeating nystagmus noted in Lt sidelying position; (-) Rt sidelying test with no nystagmus and no c/o vertigo in test position  (+) Lt Dix-Hallpike test with rotary nystagmus and upbeating nystagmus in test position (-) Rt Dix-Hallpike test  (-) Rt and Lt horizontal roll test  Self care; discussed symptoms of vertigo and  activities/movements that provoke it Blood pressure recorded - see BP reading recorded above - pt states he forgot to take BP medication this am as he was in  A hurry to get here for 8:00 PT appt.   Reviewed Brandt-Daroff exercises for HEP if vertigo not fully resolved after Epley maneuver tx today             Vestibular Treatment/Exercise - 07/15/16 0001      Vestibular Treatment/Exercise   Vestibular Treatment Provided Canalith Repositioning   Canalith Repositioning Epley Manuever Left      EPLEY MANUEVER LEFT   Number of Reps  3   Overall Response  Improved Symptoms    RESPONSE DETAILS LEFT minimal nystagmus noted on 3rd rep of Epley's               PT Education - 07/15/16 0958    Education provided Yes   Education Details Austin MilesBrandt Daroff exercises for habituation of Lt BPPV   Person(s) Educated Patient   Methods Explanation   Comprehension Verbalized understanding;Returned demonstration             PT Long Term Goals - 06/26/16 1610      PT LONG TERM GOAL #1   Title Pt will have a (-) Rt Dix-Hallpike test to indicate resolution of Rt BPPV.  07-17-16  Time 3   Period Weeks   Status New     PT LONG TERM GOAL #2   Title Pt will report no vertigo with bed mobility or with transitional movements.  07-17-16   Time 3   Period Weeks   Status New     PT LONG TERM GOAL #3   Title Independent in HEP for habituation or self treatment as needed for re-occurrence of BPPV.  07-17-16   Time 3   Period Weeks   Status New               Plan - 07/15/16 1610    Clinical Impression Statement Pt has signs and symptoms consistent with Lt BPPV - posterior canalithiasis - evidenced by Lt upbeating rotary nystagmus noted in Lt Dix-Hallpike test position and in Lt sidelying.  Symptoms significantly improved with Epley's maneuver as very minimal to no nystagmus observed on 2nd and 3rd rep of maneuver   Rehab Potential Good   PT Frequency 2x / week   PT Duration  3 weeks   PT Treatment/Interventions ADLs/Self Care Home Management;Canalith Repostioning;Therapeutic activities;Therapeutic exercise;Balance training;Neuromuscular re-education;Patient/family education;Vestibular   PT Next Visit Plan Reassess Lt BPPV - treat prn with Epley's; check HEP; Redo SOT? D/C if no vertigo persists   PT Home Exercise Plan Brandt-Daroff exercises prn   Consulted and Agree with Plan of Care Patient;Family member/caregiver      Patient will benefit from skilled therapeutic intervention in order to improve the following deficits and impairments:  Dizziness, Decreased balance  Visit Diagnosis: BPPV (benign paroxysmal positional vertigo), left     Problem List There are no active problems to display for this patient.   Kary Kos, PT 07/15/2016, 10:07 AM  [facility] [address] [place], [place], [address] Phone: [phone]   Fax:  [phone]  Name: Caidon Foti MRN: [id number] Date of Birth: 18-Jun-1960

## 2016-07-15 NOTE — Patient Instructions (Signed)
  Please complete the assigned speech therapy homework prior to your next session and return it to the speech therapist at your next visit.  

## 2016-07-15 NOTE — Therapy (Signed)
[facility] [address] [place], [place], [address] Phone: [phone]   Fax:  [phone]  Speech Language Pathology Treatment  Patient Details  Name: Frank Valdez MRN: [id number] Date of Birth: 04/30/1960 Referring Provider: Dr.   Neta Mends Date: 07/15/2016      End of Session - 07/15/16 1654    Visit Number 5   Number of Visits 6   Date for SLP Re-Evaluation 07/24/16   SLP Start Time 1535   SLP Stop Time  1616   SLP Time Calculation (min) 41 min   Activity Tolerance Patient tolerated treatment well      Past Medical History:  Diagnosis Date  . Bleeding in head following injury with prolonged (more than 24 hours) loss of consciousness with return to pre-existing conscious level (HCC)   . Hypertension     No past surgical history on file.  There were no vitals filed for this visit.      Subjective Assessment - 07/15/16 1540    Subjective Pt used his calendar to see if OT appointment would fit into his schedule.   Currently in Pain? No/denies               ADULT SLP TREATMENT - 07/15/16 1540      General Information   Behavior/Cognition Alert;Cooperative;Pleasant mood     Treatment Provided   Treatment provided Cognitive-Linquistic     Cognitive-Linquistic Treatment   Treatment focused on Cognition   Skilled Treatment "I'm doing pretty good with (the meds) because I have the alarm for it when it is time to do it." SLP focused on attention to detail, reasoning, and awareness by having pt correct his homework. Reduced emergent awareness with min-mod complex reasoning task, requirng mod SLP VC and questioning cues. In detaiiled task (route finding with map), pt req'd SLP occasional mod-max A initially, faded to min A  rarely after SLP cue to systematically look lt-rt, top to bottom for destinations on the map.     Assessment / Recommendations / Plan   Plan Continue with current plan of care      Progression Toward Goals   Progression toward goals Progressing toward goals            SLP Short Term Goals - 07/15/16 1603      SLP SHORT TERM GOAL #1   Title Pt will complete Level 2 Executive Function (thought organization, problem solving, sequencing, planning, and reasoning) tasks with 90% accuracy given mod cues   Time 3   Period Weeks   Status On-going     SLP SHORT TERM GOAL #2   Title Pt will identify and implement compensatory strategy for functional recall with mod cues for use outside of therapy   Time --   Period --   Status Achieved          SLP Long Term Goals - 07/15/16 1648      SLP LONG TERM GOAL #1   Title Pt will complete Level 3 Executive Function (thought organization, problem solving, sequencing, planning, and reasoning) tasks with 90% accuracy given min cues   Time 3   Period Weeks   Status On-going     SLP LONG TERM GOAL #2   Title Pt will identify and implement compensatory strategy for functional recall with independent use outside of therapy   Time 3   Period Weeks   Status On-going          Plan - 07/15/16 1702  Clinical Impression Statement Pt has not missed meds times with use of timer, since last visit. Tasks targeting attention to detail and reasoning/organization completed with extended time and cues from SLP for all tasks Cintinue skilled ST to maximize cognition for possible return to work.   Speech Therapy Frequency 1x /week   Duration --  6 weeks   Treatment/Interventions SLP instruction and feedback;Compensatory strategies;Functional tasks;Cognitive reorganization;Compensatory techniques;Multimodal communcation approach;Patient/family education   Potential to Achieve Goals Good   Potential Considerations Ability to learn/carryover information      Patient will benefit from skilled therapeutic intervention in order to improve the following deficits and impairments:   Cognitive communication deficit    Problem  List There are no active problems to display for this patient.   [facility],Ernesto Lashway ,MS, CCC-SLP  07/15/2016, 5:05 PM  [facility] [address] [place], [place], [address] Phone: [phone]   Fax:  [phone]   Name: Frank Valdez MRN: [id number] Date of Birth: June 21, 1960

## 2016-07-22 ENCOUNTER — Ambulatory Visit: Payer: Worker's Compensation | Admitting: Occupational Therapy

## 2016-07-22 ENCOUNTER — Ambulatory Visit: Payer: 59 | Attending: Psychiatry

## 2016-07-22 DIAGNOSIS — R41841 Cognitive communication deficit: Secondary | ICD-10-CM | POA: Insufficient documentation

## 2016-07-24 NOTE — Therapy (Signed)
[facility] [address] [place], [place], [address] Phone: [phone]   Fax:  [phone]  Speech Language Pathology Treatment  Patient Details  Name: Frank Valdez MRN: [id number] Date of Birth: 12/06/1960 Referring Provider: Darrin Nipper M.D.  Encounter Date: 07/22/2016      End of Session - 07/24/16 1621    Visit Number 6   Number of Visits 6   Date for SLP Re-Evaluation 07/24/16   Authorization - Visit Number 6   Authorization - Number of Visits 6   SLP Start Time 1105   SLP Stop Time  1145   SLP Time Calculation (min) 40 min   Activity Tolerance Patient tolerated treatment well      Past Medical History:  Diagnosis Date  . Bleeding in head following injury with prolonged (more than 24 hours) loss of consciousness with return to pre-existing conscious level (HCC)   . Hypertension     No past surgical history on file.  There were no vitals filed for this visit.      Subjective Assessment - 07/24/16 1621    Subjective Pt did not recall OT appointment setup last week.   Patient is accompained by: Family member  daughter            ADULT SLP TREATMENT - 07/24/16 0001      General Information   Behavior/Cognition Alert;Cooperative;Pleasant mood     Treatment Provided   Treatment provided Cognitive-Linquistic     Cognitive-Linquistic Treatment   Treatment focused on Cognition   Skilled Treatment Pt reports his medication administration continues to go well. With neuro follow up yesterday, pt recalled approx 85% of results of that follow up, recalled the last 15% with SLP mod verbal cues. Pt had questions written for MD yesterday but forgot to write one more in his planner and forgot to ask. Pt reasoned he should have copmleted that prior to appt. With min A from SLP, he reasoned/planned he will write questions for ENT on his paper with ENT address and referral info he rec'd yesterday at MD follow  up until he knows the appointment date.      Assessment / Recommendations / Plan   Plan Continue with current plan of care     Progression Toward Goals   Progression toward goals Progressing toward goals            SLP Short Term Goals - 07/24/16 1628      SLP SHORT TERM GOAL #1   Title Pt will complete Level 2 Executive Function (thought organization, problem solving, sequencing, planning, and reasoning) tasks with 90% accuracy given mod cues   Time 2   Period Weeks   Status On-going     SLP SHORT TERM GOAL #2   Title Pt will identify and implement compensatory strategy for functional recall with mod cues for use outside of therapy   Status Achieved          SLP Long Term Goals - 07/24/16 1628      SLP LONG TERM GOAL #1   Title Pt will complete Level 3 Executive Function (thought organization, problem solving, sequencing, planning, and reasoning) tasks with 90% accuracy given min cues   Time 4   Period Weeks   Status On-going     SLP LONG TERM GOAL #2   Title Pt will identify and implement compensatory strategy for functional recall with independent use outside of therapy   Time 4   Period Weeks  Status On-going          Plan - 07/24/16 1625    Clinical Impression Statement Pt reported functional tasks reasoning/organization completed regarding MD appointment with rare min A from SLP for anticiapatory awarness for how to fix for ENT appointment (see "skilled intervention" for details. SLP recommends continued skilled ST for 4 more sessions to maximize areas of cognitive-linguistics for possible return to work.   Speech Therapy Frequency 1x /week   Duration 4 weeks    Treatment/Interventions SLP instruction and feedback;Compensatory strategies;Functional tasks;Cognitive reorganization;Compensatory techniques;Multimodal communcation approach;Patient/family education   Potential to Achieve Goals Good   Potential Considerations Ability to learn/carryover  information      Patient will benefit from skilled therapeutic intervention in order to improve the following deficits and impairments:   Cognitive communication deficit    Problem List There are no active problems to display for this patient.   [facility] ,MS, CCC-SLP  07/24/2016, 4:29 PM  [facility] [address] [place], [place], [address] Phone: [phone]   Fax:  [phone]   Name: Frank Valdez MRN: [id number] Date of Birth: Mar 13, 1961

## 2016-07-24 NOTE — Patient Instructions (Signed)
  Please complete the assigned speech therapy homework prior to your next session and return it to the speech therapist at your next visit.  

## 2016-07-27 ENCOUNTER — Ambulatory Visit: Payer: Worker's Compensation | Attending: Psychiatry | Admitting: Occupational Therapy

## 2016-07-27 DIAGNOSIS — I69018 Other symptoms and signs involving cognitive functions following nontraumatic subarachnoid hemorrhage: Secondary | ICD-10-CM

## 2016-07-27 DIAGNOSIS — M6281 Muscle weakness (generalized): Secondary | ICD-10-CM | POA: Diagnosis present

## 2016-07-27 NOTE — Therapy (Signed)
[facility] [address] [place], [place], [address] Phone: [phone]   Fax:  [phone]  Occupational Therapy Treatment  Patient Details  Name: Frank Valdez MRN: [id number] Date of Birth: 08/22/1960 Referring Provider: Dr. Darrin Nipper  Encounter Date: 07/27/2016      OT End of Session - 07/27/16 1335    Visit Number 5   Number of Visits 6   Date for OT Re-Evaluation 08/14/16   Authorization Type WC - Case manager Gretchen Portela (fax # [phone]) - only approved 6 visits, but requesting 8 OT visits   Authorization Time Period 06/17/16 - 08/14/16   Authorization - Visit Number 5   Authorization - Number of Visits 6   OT Start Time 1020   OT Stop Time 1100   OT Time Calculation (min) 40 min   Activity Tolerance Patient tolerated treatment well      Past Medical History:  Diagnosis Date  . Bleeding in head following injury with prolonged (more than 24 hours) loss of consciousness with return to pre-existing conscious level (HCC)   . Hypertension     No past surgical history on file.  There were no vitals filed for this visit.      Subjective Assessment - 07/27/16 1026    Subjective  I asked the doctor about lifting restrictions but he said we should determine that   Patient is accompained by: Family member   Pertinent History SDH 05/26/16, HTN   Currently in Pain? No/denies                      OT Treatments/Exercises (OP) - 07/27/16 0001      ADLs   ADL Comments Pt saw neurologist last week and per MD notes, recommends a Functional Capacity and/or work hardening evaluation. Will request referral be sent to our [facility] rehab location (where they perform this type of evaluation). Based on these recommendations, pt has no actual lifting precaution, however, this evaluation will determine how much he can safely lift for work related tasks. Pt also has other factors which may impede return to  work including: continued vertigo, headaches, and cognitive deficits. Also, reviewed plan of care and progress towards goals. Recommended pt begin taking out trash and beginning light yardwork with modifications and supervision (see pt instructions)      Shoulder Exercises: ROM/Strengthening   Other ROM/Strengthening Exercises Reviewed theraband HEP - Pt still required min cues to perform correctly d/t memory deficits but says he performs better with pictures in front of him.      Visual/Perceptual Exercises   Scanning - Environmental Scanning for items while performing physical task (tossing ball): pt id 12/14 items on 1st trial (86% accuracy). Pt improved since last time assessed, but still question safety with driving                     OT Long Term Goals - 07/27/16 1336      OT LONG TERM GOAL #1   Title Independent with HEP for UE strength/endurance (due 08/14/16)    Time 8   Period Weeks   Status Achieved     OT LONG TERM GOAL #2   Title Pt to verbalize understanding of memory compensatory strategies for medication management and financial management   Time 8   Period Weeks   Status Achieved     OT LONG TERM GOAL #3   Title Pt to return to simple IADL tasks  including: taking out trash, yardwork   Time 8   Period Weeks   Status On-going  07/27/16: pt instructed to begin     OT LONG TERM GOAL #4   Title Pt to demo divided attention b/t environmental scanning and physical tasks @ 90% accuracy or greater in prep for driving   Time 8   Period Weeks   Status On-going  07/27/16: approx 85%     OT LONG TERM GOAL #5   Title Pt to lift 25 lbs BUE's from floor to waist height and 10 lbs overhead in prep for return to work    Time 8   Period Weeks   Status On-going  07/27/16: MD recommended FCE therefore may begin working on lifting tasks               Plan - 07/27/16 1337    Clinical Impression Statement Pt making progress towards goals, but limited d/t  vertigo, cognitive deficits and pt's ability to follow through with recommendations and following up with referring MD. Therapist's main concerns re: return to work are: strength/endurance, vertigo, cognition, and ? safety with driving. Pt may need driving evaluation in future prior to return to driving.    Rehab Potential Good   OT Frequency --  1-2 times/week   OT Duration --  for additional 4 weeks   OT Treatment/Interventions Self-care/ADL training;DME and/or AE instruction;Patient/family education;Therapeutic exercises;Therapeutic activities;Neuromuscular education;Functional Mobility Training;Passive range of motion;Cognitive remediation/compensation;Manual Therapy;Energy conservation;Visual/perceptual remediation/compensation   Plan recommend continuing O.T. for additional 4 weeks (requesting 8 more visits from case manager) to address strength/endurance, divided attention tasks   Recommended Other Services Functional Capacity evaluation per MD recommendations   Consulted and Agree with Plan of Care Patient      Patient will benefit from skilled therapeutic intervention in order to improve the following deficits and impairments:  Decreased endurance, Decreased balance, Decreased knowledge of use of DME, Impaired UE functional use, Decreased mobility, Decreased cognition, Decreased strength, Decreased coordination, Decreased knowledge of precautions, Impaired vision/preception  Visit Diagnosis: Other symptoms and signs involving cognitive functions following nontraumatic subarachnoid hemorrhage - Plan: Ot plan of care cert/re-cert  Muscle weakness (generalized) - Plan: Ot plan of care cert/re-cert    Problem List There are no active problems to display for this patient.   Kelli Churn, OTR/L 07/27/2016, 1:47 PM  [facility] [address] [place], [place], [address] Phone: [phone]   Fax:  [phone]  Name:  Frank Valdez MRN: [id number] Date of Birth: 06-08-1960

## 2016-07-27 NOTE — Patient Instructions (Signed)
RECOMMENDATIONS:    1. Begin taking out trash   2. Begin light yardwork like mowing with self propelled mower - you may need to break it apart into several days (ex: do front yard one day, do back yard one day). Make sure someone is home when you are mowing  3. Please continue to write things down in memory notebook and calendar to f/u with appointments, etc.

## 2016-07-29 ENCOUNTER — Ambulatory Visit: Payer: Worker's Compensation | Attending: Psychiatry | Admitting: Occupational Therapy

## 2016-07-29 ENCOUNTER — Ambulatory Visit: Payer: Worker's Compensation | Attending: Psychiatry

## 2016-07-29 ENCOUNTER — Ambulatory Visit: Payer: Worker's Compensation | Admitting: Occupational Therapy

## 2016-07-29 DIAGNOSIS — I69018 Other symptoms and signs involving cognitive functions following nontraumatic subarachnoid hemorrhage: Secondary | ICD-10-CM | POA: Diagnosis present

## 2016-07-29 DIAGNOSIS — I6200 Nontraumatic subdural hemorrhage, unspecified: Secondary | ICD-10-CM | POA: Insufficient documentation

## 2016-07-29 DIAGNOSIS — R41841 Cognitive communication deficit: Secondary | ICD-10-CM | POA: Diagnosis present

## 2016-07-29 DIAGNOSIS — R2681 Unsteadiness on feet: Secondary | ICD-10-CM | POA: Diagnosis present

## 2016-07-29 DIAGNOSIS — M6281 Muscle weakness (generalized): Secondary | ICD-10-CM | POA: Insufficient documentation

## 2016-07-29 DIAGNOSIS — H8112 Benign paroxysmal vertigo, left ear: Secondary | ICD-10-CM | POA: Diagnosis present

## 2016-07-29 NOTE — Therapy (Signed)
[facility] [address] [place], [place], [address] Phone: [phone]   Fax:  [phone]  Speech Language Pathology Treatment  Patient Details  Name: Frank Valdez MRN: [id number] Date of Birth: 11/10/1960 Referring Provider: Darrin Nipper M.D.  Encounter Date: 07/29/2016      End of Session - 07/29/16 1127    Visit Number 7   Number of Visits 10   Date for SLP Re-Evaluation 09/11/16   Authorization - Visit Number 7   Authorization - Number of Visits 10   SLP Start Time 1105   SLP Stop Time  1145   SLP Time Calculation (min) 40 min      Past Medical History:  Diagnosis Date  . Bleeding in head following injury with prolonged (more than 24 hours) loss of consciousness with return to pre-existing conscious level (HCC)   . Hypertension     No past surgical history on file.  There were no vitals filed for this visit.      Subjective Assessment - 07/29/16 1112    Subjective Pt has FCE eval next week.    Patient is accompained by: Family member  daughter   Currently in Pain? No/denies               ADULT SLP TREATMENT - 07/29/16 1114      General Information   Behavior/Cognition Alert;Cooperative;Pleasant mood     Treatment Provided   Treatment provided Cognitive-Linquistic     Cognitive-Linquistic Treatment   Treatment focused on Cognition   Skilled Treatment Pt showed SLP where he has written questions for ENT on his referral sheet/address for ENT in [place], as he told SLP last session. Additionally, pt wrote when his FCE was (08-05-16), and immediately referred to his calendar when telling SLP about his FCE. In a simple deduction task, pt req'd extra time and rare min cues to complete with accuracy. Pt reasoned that his daugter would have finished before him before the accident because her native language isn't Spanish (his is [place]). SLP assured pt that both he and his daughter would have  completed puzzle in comparable time. Daughter appeared to agree.     Assessment / Recommendations / Plan   Plan Continue with current plan of care     Progression Toward Goals   Progression toward goals Progressing toward goals            SLP Short Term Goals - 07/29/16 1429      SLP SHORT TERM GOAL #1   Title Pt will complete Level 2 Executive Function (thought organization, problem solving, sequencing, planning, and reasoning) tasks with 90% accuracy given mod cues   Status Achieved     SLP SHORT TERM GOAL #2   Title Pt will identify and implement compensatory strategy for functional recall with mod cues for use outside of therapy   Status Achieved          SLP Long Term Goals - 07/29/16 1121      SLP LONG TERM GOAL #1   Title Pt will complete Level 3 Executive Function (thought organization, problem solving, sequencing, planning, and reasoning) tasks with 90% accuracy given min cues   Time 4   Period Weeks   Status On-going     SLP LONG TERM GOAL #2   Title Pt will identify and implement compensatory strategy for functional recall with independent use outside of therapy   Time --   Period --   Status Achieved  Plan - 07/29/16 1126    Clinical Impression Statement SLP recommends continued skilled ST for 4 more sessions to maximize areas of cognitive-linguistics for possible return to work.      Patient will benefit from skilled therapeutic intervention in order to improve the following deficits and impairments:   Cognitive communication deficit    Problem List There are no active problems to display for this patient.   [facility] ,MS, CCC-SLP  07/29/2016, 2:30 PM  [facility] [address] [place], [place], [address] Phone: [phone]   Fax:  [phone]   Name: Frank Valdez MRN: [id number] Date of Birth: 1960-12-06

## 2016-07-29 NOTE — Therapy (Signed)
[facility] [address] [place], [place], [address] Phone: [phone]   Fax:  [phone]  Occupational Therapy Treatment  Patient Details  Name: Frank Valdez MRN: [id number] Date of Birth: 14-Sep-1960 Referring Provider: Dr. Darrin Nipper  Encounter Date: 07/29/2016      OT End of Session - 07/29/16 1149    Visit Number 6   Number of Visits 9   Date for OT Re-Evaluation 08/14/16   Authorization Type WC - Case manager Gretchen Portela (fax # [phone]) - only approved 6 visits   ( 8 additional visits approved though 09/14/16)   Authorization Time Period 06/17/16 - 08/14/16   Authorization - Visit Number 6   Authorization - Number of Visits 14   OT Start Time 1030  pt arrived on wrong day/ time, short session)   OT Stop Time 1100   OT Time Calculation (min) 30 min   Activity Tolerance Patient tolerated treatment well   Behavior During Therapy [facility] for tasks assessed/performed      Past Medical History:  Diagnosis Date  . Bleeding in head following injury with prolonged (more than 24 hours) loss of consciousness with return to pre-existing conscious level (HCC)   . Hypertension     No past surgical history on file.  There were no vitals filed for this visit.      Subjective Assessment - 07/29/16 1146    Subjective  Pt arrived for OT on the wrong day/ time, updated schedule printed for pt.   Pertinent History SDH 05/26/16, HTN   Currently in Pain? No/denies                treatment: Divided attention activities: ambulating while tossing ball in hand and performing category generation, mod v.c. Followed by ambulating and tossing ball while scanning environment for playing cards, pt performed with 100% accuracy today. Arm bike x 6 mins level 5 for conditioning, reviewed shoulder flexion, extension and biceps curls with red theraband from HEP, min v.c. To initiate exercises.                    OT Long Term Goals - 07/27/16 1336      OT LONG TERM GOAL #1   Title Independent with HEP for UE strength/endurance (due 08/14/16)    Time 8   Period Weeks   Status Achieved     OT LONG TERM GOAL #2   Title Pt to verbalize understanding of memory compensatory strategies for medication management and financial management   Time 8   Period Weeks   Status Achieved     OT LONG TERM GOAL #3   Title Pt to return to simple IADL tasks including: taking out trash, yardwork   Time 8   Period Weeks   Status On-going  07/27/16: pt instructed to begin     OT LONG TERM GOAL #4   Title Pt to demo divided attention b/t environmental scanning and physical tasks @ 90% accuracy or greater in prep for driving   Time 8   Period Weeks   Status On-going  07/27/16: approx 85%     OT LONG TERM GOAL #5   Title Pt to lift 25 lbs BUE's from floor to waist height and 10 lbs overhead in prep for return to work    Time 8   Period Weeks   Status On-going  07/27/16: MD recommended FCE therefore may begin working on lifting tasks  Plan - 07/29/16 1147    Clinical Impression Statement Pt is progressing towards goals. He arrived today for OT on the wrong day/ time, but therapsit was able to se him. Pt demonstrates improved environmental scanning today.   Rehab Potential Good   OT Frequency --  1-2x week   OT Duration 4 weeks   OT Treatment/Interventions Self-care/ADL training;DME and/or AE instruction;Patient/family education;Therapeutic exercises;Therapeutic activities;Neuromuscular education;Functional Mobility Training;Passive range of motion;Cognitive remediation/compensation;Manual Therapy;Energy conservation;Visual/perceptual remediation/compensation   Plan continue to address divided attention and strength, schedule more OT visits as pt was approved for more visits,    Consulted and Agree with Plan of Care Patient   Family Member Consulted daughter      Patient will benefit  from skilled therapeutic intervention in order to improve the following deficits and impairments:  Decreased endurance, Decreased balance, Decreased knowledge of use of DME, Impaired UE functional use, Decreased mobility, Decreased cognition, Decreased strength, Decreased coordination, Decreased knowledge of precautions, Impaired vision/preception  Visit Diagnosis: Other symptoms and signs involving cognitive functions following nontraumatic subarachnoid hemorrhage  Muscle weakness (generalized)  Unsteadiness on feet    Problem List There are no active problems to display for this patient.   Allysson Rinehimer 07/29/2016, 11:52 AM  [facility] [address] [place], [place], [address] Phone: [phone]   Fax:  [phone]  Name: Frank Valdez MRN: [id number] Date of Birth: Jul 20, 1960

## 2016-07-30 ENCOUNTER — Ambulatory Visit: Payer: Worker's Compensation | Admitting: Occupational Therapy

## 2016-07-30 ENCOUNTER — Ambulatory Visit: Payer: Worker's Compensation | Admitting: Physical Therapy

## 2016-07-30 DIAGNOSIS — H8112 Benign paroxysmal vertigo, left ear: Secondary | ICD-10-CM

## 2016-07-30 DIAGNOSIS — R2681 Unsteadiness on feet: Secondary | ICD-10-CM

## 2016-07-30 DIAGNOSIS — I69018 Other symptoms and signs involving cognitive functions following nontraumatic subarachnoid hemorrhage: Secondary | ICD-10-CM | POA: Diagnosis not present

## 2016-07-31 NOTE — Therapy (Signed)
[facility] [address] [place], [place], [address] Phone: [phone]   Fax:  [phone]  Physical Therapy Treatment  Patient Details  Name: Frank Valdez MRN: [id number] Date of Birth: [birth year] Referring Provider: Darrin Nipper, MD  Encounter Date: 07/30/2016      PT End of Session - 07/31/16 1107    Visit Number 4   Number of Visits 6   Date for PT Re-Evaluation 07/26/16   Authorization Type Worker's Comp   Authorization - Visit Number 4   Authorization - Number of Visits 6   PT Start Time 0932   PT Stop Time 1016   PT Time Calculation (min) 44 min      Past Medical History:  Diagnosis Date  . Bleeding in head following injury with prolonged (more than 24 hours) loss of consciousness with return to pre-existing conscious level (HCC)   . Hypertension     No past surgical history on file.  There were no vitals filed for this visit.      Subjective Assessment - 07/31/16 1100    Subjective Pt reports he continues to have some vertigo - not as bad as it was in previous session 2 weeks ago; has not done alot of standing on foam exercises due to fear of falling and not having a sturdy chair at home   Patient is accompained by: Family member   Pertinent History SDH due to fall   Patient Stated Goals resolve the vertigo   Currently in Pain? No/denies        Positional testing:  (-) Rt Dix-Hallpike test;  (+) Lt Dix-Hallpike test for c/o vertigo in test position but no nystagmus noted Pt reported some slight vertigo in Lt sidelying position but no nystagmus noted  Pt did report dizziness "light-headedness" with return to upright position from both Rt and Lt Dix-Hallpike test positions and from both sidelying positions  No c/o vertigo in Rt sidelying and no nystagmus noted  (-) Rt and Lt horizontal roll tests with no nystagmus and no c/o vertigo  Performed Epley maneuver x 2 reps for Lt posterior  canalithiasis based on pt's symptoms  Improvement reported on 2nd rep with no c/o vertigo in any position of maneuver  Pt performed standing on foam exercises - in corner -with feet apart with EO and EC and then with feet together with EO and EC Targets used for improved gaze stabilization with EO                        PT Education - 07/31/16 1104    Education provided Yes   Education Details Emphasized importance and benefit of standing on foam exercises - recommended consistent compliance for improvement   Person(s) Educated Patient;Child(ren)   Methods Explanation;Demonstration   Comprehension Verbalized understanding;Returned demonstration             PT Long Term Goals - 06/26/16 1610      PT LONG TERM GOAL #1   Title Pt will have a (-) Rt Dix-Hallpike test to indicate resolution of Rt BPPV.  07-17-16   Time 3   Period Weeks   Status New     PT LONG TERM GOAL #2   Title Pt will report no vertigo with bed mobility or with transitional movements.  07-17-16   Time 3   Period Weeks   Status New     PT LONG TERM GOAL #3   Title Independent in  HEP for habituation or self treatment as needed for re-occurrence of BPPV.  07-17-16   Time 3   Period Weeks   Status New               Plan - 07/31/16 1109    Clinical Impression Statement No nystagmus observed during positional testing but pt reported some increased vertigo in Lt Dix-Hallpike test position; no reports of vertigo in any position of 2nd rep of Epley's; balance on foam exercises was very good with only minimal sway noted with EC and no LOB occurring   Rehab Potential Good   PT Frequency 2x / week   PT Duration 3 weeks   PT Treatment/Interventions ADLs/Self Care Home Management;Canalith Repostioning;Therapeutic activities;Therapeutic exercise;Balance training;Neuromuscular re-education;Patient/family education;Vestibular   PT Next Visit Plan Reassess Lt BPPV; redo SOT and D/C   PT Home  Exercise Plan Brandt-Daroff exercises prn   Consulted and Agree with Plan of Care Patient      Patient will benefit from skilled therapeutic intervention in order to improve the following deficits and impairments:  Dizziness, Decreased balance  Visit Diagnosis: BPPV (benign paroxysmal positional vertigo), left  Unsteadiness on feet     Problem List There are no active problems to display for this patient.   Kary Kos, PT 07/31/2016, 11:13 AM  [facility] [address] [place], [place], [address] Phone: [phone]   Fax:  [phone]  Name: Frank Valdez MRN: [id number] Date of Birth: August 15, 1960

## 2016-08-04 ENCOUNTER — Ambulatory Visit: Payer: Worker's Compensation | Admitting: Occupational Therapy

## 2016-08-05 ENCOUNTER — Ambulatory Visit: Payer: Worker's Compensation

## 2016-08-05 DIAGNOSIS — R41841 Cognitive communication deficit: Secondary | ICD-10-CM | POA: Diagnosis not present

## 2016-08-05 DIAGNOSIS — I6200 Nontraumatic subdural hemorrhage, unspecified: Secondary | ICD-10-CM

## 2016-08-05 NOTE — Therapy (Signed)
[facility] [address] [place], [place], [address] Phone: [phone]   Fax:  [phone]  Physical Therapy Evaluation/FCE  Patient Details  Name: Frank Valdez MRN: [id number] Date of Birth: March 13, 1961 Referring Provider: Darrin Nipper , MD  Encounter Date: 08/05/2016      PT End of Session - 08/05/16 0742    Visit Number 1   Number of Visits 1   Authorization Type Worker's Comp   PT Start Time 0745   PT Stop Time 1245   PT Time Calculation (min) 300 min   Activity Tolerance Patient tolerated treatment well;No increased pain   Behavior During Therapy WFL for tasks assessed/performed      Past Medical History:  Diagnosis Date  . Bleeding in head following injury with prolonged (more than 24 hours) loss of consciousness with return to pre-existing conscious level (HCC)   . Hypertension     No past surgical history on file.  There were no vitals filed for this visit.       Subjective Assessment - 08/05/16 1703    Subjective Mr Barg completed the FCE testing rated at medium level work.  He only had 3-4 incidents of dizziness and only with transitions from sitting to and from lying down.             [facility] PT Assessment - 08/05/16 0001      Assessment   Medical Diagnosis Subdural Hemorrhage   Referring Provider Darrin Nipper , MD   Onset Date/Surgical Date 05/26/16                           PT Education - 08/05/16 1707    Education provided Yes   Education Details Results of FCE.   Person(s) Educated Patient   Methods Explanation   Comprehension Verbalized understanding             PT Long Term Goals - 06/26/16 1610      PT LONG TERM GOAL #1   Title Pt will have a (-) Rt Dix-Hallpike test to indicate resolution of Rt BPPV.  07-17-16   Time 3   Period Weeks   Status New     PT LONG TERM GOAL #2   Title Pt will report no vertigo with bed mobility or with transitional  movements.  07-17-16   Time 3   Period Weeks   Status New     PT LONG TERM GOAL #3   Title Independent in HEP for habituation or self treatment as needed for re-occurrence of BPPV.  07-17-16   Time 3   Period Weeks   Status New               Plan - 08/05/16 0743    Clinical Impression Statement Mr Ledee completed the FCE testing without pain with only brief dizziness with bending forward. His blood pressure was consistently high and excessively high for more active test in lifting category and with stairs.  His BP was as low as 122/86. 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    PT Frequency One time visit   PT Next Visit Plan No follow up post FCE. Followed by Neuro OT   Consulted and Agree with Plan of Care Patient     FCE to be faxed to  Dr Luberta Robertson. Patient will benefit from skilled therapeutic intervention in order to improve the following deficits and impairments:     Visit Diagnosis: Subdural hemorrhage (HCC)     Problem List There are no active problems to display for this patient.   Caprice Red  PT 08/05/2016, 5:13 PM  [facility] [address] [place], [place], [address] Phone: [phone]   Fax:  [phone]  Name: Frank Valdez MRN: [id number] Date of Birth: 09-22-1960

## 2016-08-10 ENCOUNTER — Ambulatory Visit: Payer: Worker's Compensation | Admitting: Physical Therapy

## 2016-08-10 VITALS — BP 158/87 | HR 71

## 2016-08-10 DIAGNOSIS — R2681 Unsteadiness on feet: Secondary | ICD-10-CM

## 2016-08-10 DIAGNOSIS — I69018 Other symptoms and signs involving cognitive functions following nontraumatic subarachnoid hemorrhage: Secondary | ICD-10-CM | POA: Diagnosis not present

## 2016-08-10 DIAGNOSIS — H8112 Benign paroxysmal vertigo, left ear: Secondary | ICD-10-CM

## 2016-08-11 ENCOUNTER — Ambulatory Visit: Payer: Worker's Compensation | Attending: Psychiatry | Admitting: Occupational Therapy

## 2016-08-11 DIAGNOSIS — I69018 Other symptoms and signs involving cognitive functions following nontraumatic subarachnoid hemorrhage: Secondary | ICD-10-CM | POA: Diagnosis present

## 2016-08-11 DIAGNOSIS — M6281 Muscle weakness (generalized): Secondary | ICD-10-CM | POA: Diagnosis not present

## 2016-08-11 NOTE — Therapy (Signed)
[facility] [address] [place], [place], [address] Phone: [phone]   Fax:  [phone]  Occupational Therapy Treatment  Patient Details  Name: Frank Valdez MRN: [id number] Date of Birth: 1960/05/14 Referring Provider: Dr. Darrin Nipper  Encounter Date: 08/11/2016      OT End of Session - 08/11/16 1059    Visit Number 7   Number of Visits 14   Date for OT Re-Evaluation 08/26/16   Authorization Type WC - Case manager Gretchen Portela (fax # [phone]) - only approved 6 visits   ( 8 additional visits approved though 09/14/16)   Authorization Time Period 06/17/16 - 08/14/16   Authorization - Visit Number 7   Authorization - Number of Visits 14   OT Start Time 1010   OT Stop Time 1100   OT Time Calculation (min) 50 min   Activity Tolerance Patient tolerated treatment well      Past Medical History:  Diagnosis Date  . Bleeding in head following injury with prolonged (more than 24 hours) loss of consciousness with return to pre-existing conscious level (HCC)   . Hypertension     No past surgical history on file.  There were no vitals filed for this visit.      Subjective Assessment - 08/11/16 1010    Subjective  I am still having dizziness last night and this morning   Pertinent History SDH 05/26/16, HTN   Currently in Pain? No/denies       Treatment:  BP at beginning of session: 152/90.  Pt began work related tasks including: lifting 20 lb. box from floor to table x 5 reps, walking 150 ft with 20 lb. box, then lifting 10 lb. box from floor to high overhead shelf x 5 reps BP after approx. 10 minutes of work related tasks: 171/92.  Pt then asked to perform divided attention tasks while ambulating including: tossing ball b/t hands with category generation. Pt with min drops and min difficulty w/ category gen. UBE x 8 min. Level 8 resistance for strength/endurance maintaining 40-50 RPM BP: 154/73 Continued work  related tasks: increasing to 30 lb. Box for lifting from floor to table x 5 reps, walking and stepping over obstacles 150 ft with 30 lb. Box, then lifting to overhead shelf w/ 20 lb box x 5 reps. (Approx 8 minutes)  BP = 172/80, HR = 72. After 5 minute rest, BP = 160/85                  OT Treatments/Exercises (OP) - 08/11/16 0001      ADLs   ADL Comments --                     OT Long Term Goals - 08/11/16 1102      OT LONG TERM GOAL #1   Title Independent with HEP for UE strength/endurance (due 08/14/16)    Time 8   Period Weeks   Status Achieved     OT LONG TERM GOAL #2   Title Pt to verbalize understanding of memory compensatory strategies for medication management and financial management   Time 8   Period Weeks   Status Achieved     OT LONG TERM GOAL #3   Title Pt to return to simple IADL tasks including: taking out trash, yardwork   Time 8   Period Weeks   Status On-going  07/27/16: pt instructed to begin     OT LONG TERM GOAL #4  Title Pt to demo divided attention b/t environmental scanning and physical tasks @ 90% accuracy or greater in prep for driving   Time 8   Period Weeks   Status On-going  07/27/16: approx 85%     OT LONG TERM GOAL #5   Title Pt to lift 50 lbs BUE's from floor to waist height and 25 lbs overhead in prep for return to work    Time 8   Period Weeks   Status Revised  07/27/16: MD recommended FCE therefore may begin working on lifting tasks               Plan - 08/11/16 1101    Clinical Impression Statement Pt progressing towards remaining goals. Pt with fluctuating BP after work related tasks   Rehab Potential Good   OT Frequency --  1-2x/wk   OT Treatment/Interventions Self-care/ADL training;DME and/or AE instruction;Patient/family education;Therapeutic exercises;Therapeutic activities;Neuromuscular education;Functional Mobility Training;Passive range of motion;Cognitive remediation/compensation;Manual  Therapy;Energy conservation;Visual/perceptual remediation/compensation   Plan continue work related tasks, divided attention, environmental scanning, continue to monitor BP   Consulted and Agree with Plan of Care Patient      Patient will benefit from skilled therapeutic intervention in order to improve the following deficits and impairments:  Decreased endurance, Decreased balance, Decreased knowledge of use of DME, Impaired UE functional use, Decreased mobility, Decreased cognition, Decreased strength, Decreased coordination, Decreased knowledge of precautions, Impaired vision/preception  Visit Diagnosis: Muscle weakness (generalized)  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   Kelli Churn, OTR/L 08/11/2016, 11:03 AM  [facility] [address] [place], [place], [address] Phone: [phone]   Fax:  [phone]  Name: Frank Valdez MRN: [id number] Date of Birth: 1960/10/16

## 2016-08-11 NOTE — Therapy (Signed)
[facility] [address] [place], [place], [address] Phone: [phone]   Fax:  [phone]  Physical Therapy Treatment  Patient Details  Name: Colleen Donahoe MRN: [id number] Date of Birth: 11/04/1960 Referring Provider: Darrin Nipper , MD  Encounter Date: 08/10/2016      PT End of Session - 08/11/16 1323    Visit Number 5   Number of Visits 6   Date for PT Re-Evaluation 08/17/16   Authorization Type Worker's Comp   Authorization - Visit Number 5   Authorization - Number of Visits 6   PT Start Time 1534   PT Stop Time 1616   PT Time Calculation (min) 42 min      Past Medical History:  Diagnosis Date  . Bleeding in head following injury with prolonged (more than 24 hours) loss of consciousness with return to pre-existing conscious level (HCC)   . Hypertension     No past surgical history on file.  Vitals:   08/10/16 1538  BP: (!) 158/87  Pulse: 71        Subjective Assessment - 08/11/16 1321    Subjective Pt reports he continues to have some dizziness but nothing like it was at first session;  had FCE last week   Patient is accompained by: Family member   Pertinent History SDH due to fall   Patient Stated Goals resolve the vertigo   Currently in Pain? No/denies         Self care; reviewed Brandt-Daroff exercises for habituation; discussed symptoms and provocation of symptoms Discussed dizziness vs. Vertigo vs. Light-headedness    Canalith repositioning for Lt BPPV due to pt reporting vertigo in Lt Dix-Hallpike test position;  Performed 2 reps Epley's maneuver with  Decreased symptoms reported on 2nd rep compared to 1st rep; no nystagmus noted in any position on rep 1 or 2   NeuroRe-ed: Pt rolled supine to Lt and Rt sidelying with no c/o vertigo  Rt Dix-Hallpike test (-) with no nystagmus and no c/o vertigo in test position Lt Dix-Hallpike test (+) for c/o vertigo but no nystagmus noted  Lt  sidelying test (+) for c/o vertigo - no nystagmus noted Rt sidelying test (-) for c/o vertigo and no nystagmus noted                         PT Long Term Goals - 06/26/16 1610      PT LONG TERM GOAL #1   Title Pt will have a (-) Rt Dix-Hallpike test to indicate resolution of Rt BPPV.  07-17-16   Time 3   Period Weeks   Status New     PT LONG TERM GOAL #2   Title Pt will report no vertigo with bed mobility or with transitional movements.  07-17-16   Time 3   Period Weeks   Status New     PT LONG TERM GOAL #3   Title Independent in HEP for habituation or self treatment as needed for re-occurrence of BPPV.  07-17-16   Time 3   Period Weeks   Status New               Plan - 08/11/16 1326    Clinical Impression Statement Pt subjectively reports dizziness with Lt sidelying but no nystagmus observed in this position, and none observed in Rt or Lt Dix-Hallpike test positions.  Pt also continues to report some light-headedness with return to upright sitting position but  no signs of BPPV observed.  Pt instructed to continue with habituation exercises.                                                                                                                              Rehab Potential Good   PT Frequency 1x / week   PT Duration 6 weeks   PT Treatment/Interventions ADLs/Self Care Home Management;Canalith Repostioning;Therapeutic activities;Therapeutic exercise;Balance training;Neuromuscular re-education;Patient/family education;Vestibular   PT Next Visit Plan D/C next session   PT Home Exercise Plan Brandt-Daroff exercises prn   Consulted and Agree with Plan of Care Patient      Patient will benefit from skilled therapeutic intervention in order to improve the following deficits and impairments:  Dizziness, Decreased balance  Visit Diagnosis: BPPV (benign paroxysmal positional vertigo), left  Unsteadiness on feet     Problem List There are no active  problems to display for this patient.   Kary Kos, PT 08/11/2016, 1:37 PM  [facility] [address] [place], [place], [address] Phone: [phone]   Fax:  [phone]  Name: Loye Vento MRN: [id number] Date of Birth: 1960/04/30

## 2016-08-17 ENCOUNTER — Ambulatory Visit: Payer: Worker's Compensation | Admitting: Occupational Therapy

## 2016-08-17 ENCOUNTER — Ambulatory Visit: Payer: Worker's Compensation

## 2016-08-17 DIAGNOSIS — I69018 Other symptoms and signs involving cognitive functions following nontraumatic subarachnoid hemorrhage: Secondary | ICD-10-CM

## 2016-08-17 DIAGNOSIS — R41841 Cognitive communication deficit: Secondary | ICD-10-CM

## 2016-08-17 DIAGNOSIS — M6281 Muscle weakness (generalized): Secondary | ICD-10-CM

## 2016-08-17 NOTE — Therapy (Signed)
[facility] [address] [place], [place], [address] Phone: [phone]   Fax:  [phone]  Speech Language Pathology Treatment  Patient Details  Name: Frank Valdez MRN: [id number] Date of Birth: 05/16/1960 Referring Provider: Darrin Nipper M.D.  Encounter Date: 08/17/2016      End of Session - 08/17/16 1713    Visit Number 8   Number of Visits 10   Date for SLP Re-Evaluation 09/11/16   Authorization - Visit Number 8   Authorization - Number of Visits 10   SLP Start Time 1103   SLP Stop Time  1145   SLP Time Calculation (min) 42 min   Activity Tolerance Patient tolerated treatment well      Past Medical History:  Diagnosis Date  . Bleeding in head following injury with prolonged (more than 24 hours) loss of consciousness with return to pre-existing conscious level (HCC)   . Hypertension     No past surgical history on file.  There were no vitals filed for this visit.      Subjective Assessment - 08/17/16 1118    Subjective Pt is only taking med for hypertension. SLP encouraged pt to contact his PCP re: other meds, specifically anti-seizure meds ,which have run out of refills.   Currently in Pain? No/denies               ADULT SLP TREATMENT - 08/17/16 1124      General Information   Behavior/Cognition Alert;Cooperative;Pleasant mood     Treatment Provided   Treatment provided Cognitive-Linquistic     Cognitive-Linquistic Treatment   Treatment focused on Cognition   Skilled Treatment "I fogot my glasses at home." Given pt's performance with divided attention with vision and physical task in OT, SLP copmleted divided attention with linguistic tasks. Pt missed 25% of card ID as he walked in clinic today with OT. Divided attention tasks (auditory and written) resulted in 50% success divided attention and 25% alternating attention.     Assessment / Recommendations / Plan   Plan Continue with  current plan of care     Progression Toward Goals   Progression toward goals Progressing toward goals            SLP Short Term Goals - 07/29/16 1429      SLP SHORT TERM GOAL #1   Title Pt will complete Level 2 Executive Function (thought organization, problem solving, sequencing, planning, and reasoning) tasks with 90% accuracy given mod cues   Status Achieved     SLP SHORT TERM GOAL #2   Title Pt will identify and implement compensatory strategy for functional recall with mod cues for use outside of therapy   Status Achieved          SLP Long Term Goals - 08/17/16 1715      SLP LONG TERM GOAL #1   Title Pt will complete Level 3 Executive Function (thought organization, problem solving, sequencing, planning, and reasoning) tasks with 90% accuracy given min cues   Time 3   Period Weeks   Status On-going     SLP LONG TERM GOAL #2   Title Pt will identify and implement compensatory strategy for functional recall with independent use outside of therapy   Status Achieved     SLP LONG TERM GOAL #3   Title pt will complete two simple- mod complex cognitive linguistic tasks at one time with 90% accuracy on each task   Time 3   Period Weeks  Status New          Plan - 08/17/16 1714    Clinical Impression Statement Pt performed with cues necessary during divided attention tasks with linguistic means (auditory and written/reading). Skilled ST needed to cont to maximize pt cognitive linguistic skills for possible return to work i nthe future.   Speech Therapy Frequency 1x /week   Duration 4 weeks   Treatment/Interventions SLP instruction and feedback;Compensatory strategies;Functional tasks;Cognitive reorganization;Compensatory techniques;Multimodal communcation approach;Patient/family education   Potential to Achieve Goals Good   Potential Considerations Ability to learn/carryover information   Consulted and Agree with Plan of Care Patient;Family member/caregiver       Patient will benefit from skilled therapeutic intervention in order to improve the following deficits and impairments:   Cognitive communication deficit    Problem List There are no active problems to display for this patient.   [facility] ,MS, CCC-SLP  08/17/2016, 5:17 PM  [facility] [address] [place], [place], [address] Phone: [phone]   Fax:  [phone]   Name: Frank Valdez MRN: [id number] Date of Birth: May 09, 1960

## 2016-08-17 NOTE — Patient Instructions (Signed)
Divided Attention ideas - any can be done together  Playing a card game either by yourself or with someone  Playing a board game with someone  Sorting playing cards  Writing down commercials on TV or the radio  Writing down news stories on TV or radio  Working outside with yard work, gardening, etc.  Washing dishes  Recite state names and capitals  Complete long division or 2 or 3 digit multiplication problems  Listen to a speech online (i.e., YouTube), to online news (NPR, CNN, etc.), or to a family member, and give pertinent information from the speech, news story, or conversation after it is over  Empty the dishwasher  Complete paper/pencil puzzles (sudoku, crosswords, word search)  Complete puzzles online (www.lumosity.com -computer/tablet based, or www.constanttherapy.com - iPad based)  Therapy worksheets/exercises  Put together a puzzle  State coins needed to make a certain change amount  Have someone read a portion of a novel to you, and you give a summary    Start at a level that you are challenged by, but also where you can see some success  

## 2016-08-17 NOTE — Therapy (Signed)
[facility] [address] [place], [place], [address] Phone: [phone]   Fax:  [phone]  Occupational Therapy Treatment  Patient Details  Name: Frank Valdez MRN: [id number] Date of Birth: [birth year] Referring Provider: Dr. Darrin Nipper  Encounter Date: 08/17/2016      OT End of Session - 08/17/16 1101    Visit Number 8   Number of Visits 14   Date for OT Re-Evaluation 08/26/16   Authorization Type WC - Case manager Gretchen Portela (fax # [phone]) - only approved 6 visits   ( 8 additional visits approved though 09/14/16)   Authorization Time Period 06/17/16 - 08/14/16   Authorization - Visit Number 8   Authorization - Number of Visits 14   OT Start Time 1015   OT Stop Time 1100   OT Time Calculation (min) 45 min   Activity Tolerance Patient tolerated treatment well      Past Medical History:  Diagnosis Date  . Bleeding in head following injury with prolonged (more than 24 hours) loss of consciousness with return to pre-existing conscious level (HCC)   . Hypertension     No past surgical history on file.  There were no vitals filed for this visit.      Subjective Assessment - 08/17/16 1020    Subjective  I still get some dizziness when I lay down   Pertinent History SDH 05/26/16, HTN   Currently in Pain? No/denies      TREATMENT:  BP at beginning of session: 145/94 Pt then lifted and carried 30 lb. Box from floor to table and while walking over obstacles (for approx 8 min.) BP 157/85 Seated: placing pegs in pegboard overhead RUE with 1 lb. weight on arm for strength/endurance x 2 rows, followed by same activity with LUE. Then removing pegs BUE's with 1 lb. weight on each arm.  UBE x 10 min. Level 8 w/ > 40 rpm's for strength/endurance. BP: 148/75 Ambulating while performing physical task and environmental scanning for divided attention: pt with min drops Lt hand while tossing ball, and found 9/12 items  first trial (75% accuracy), 2/3 remaining objects on 2nd trial.                               OT Long Term Goals - 08/17/16 1102      OT LONG TERM GOAL #1   Title Independent with HEP for UE strength/endurance (due 08/14/16)    Time 8   Period Weeks   Status Achieved     OT LONG TERM GOAL #2   Title Pt to verbalize understanding of memory compensatory strategies for medication management and financial management   Time 8   Period Weeks   Status Achieved     OT LONG TERM GOAL #3   Title Pt to return to simple IADL tasks including: taking out trash, yardwork   Time 8   Period Weeks   Status On-going  07/27/16: pt instructed to begin     OT LONG TERM GOAL #4   Title Pt to demo divided attention b/t environmental scanning and physical tasks @ 90% accuracy or greater in prep for driving   Time 8   Period Weeks   Status On-going  07/27/16: approx 85%     OT LONG TERM GOAL #5   Title Pt to lift 50 lbs BUE's from floor to waist height and 25 lbs overhead in prep for  return to work    Time 8   Period Weeks   Status On-going               Plan - 08/17/16 1103    Clinical Impression Statement Pt continues to progress towards and progress towards returning to work.    Rehab Potential Good   OT Treatment/Interventions Self-care/ADL training;DME and/or AE instruction;Patient/family education;Therapeutic exercises;Therapeutic activities;Neuromuscular education;Functional Mobility Training;Passive range of motion;Cognitive remediation/compensation;Manual Therapy;Energy conservation;Visual/perceptual remediation/compensation   Plan progress to lifting 40 lb. 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   Kelli Churn, OTR/L 08/17/2016, 11:05 AM  [facility] [address] [place], [place], [address] Phone: [phone]   Fax:  [phone]  Name: Frank Valdez MRN: [id number] Date of Birth: [birth year]

## 2016-08-20 ENCOUNTER — Ambulatory Visit: Payer: Worker's Compensation | Attending: Psychiatry | Admitting: Occupational Therapy

## 2016-08-20 DIAGNOSIS — I69018 Other symptoms and signs involving cognitive functions following nontraumatic subarachnoid hemorrhage: Secondary | ICD-10-CM | POA: Diagnosis present

## 2016-08-20 DIAGNOSIS — M6281 Muscle weakness (generalized): Secondary | ICD-10-CM | POA: Diagnosis not present

## 2016-08-20 DIAGNOSIS — R41841 Cognitive communication deficit: Secondary | ICD-10-CM | POA: Diagnosis present

## 2016-08-20 NOTE — Therapy (Signed)
[facility] [address] [place], [place], [address] Phone: [phone]   Fax:  [phone]  Occupational Therapy Treatment  Patient Details  Name: Frank Valdez MRN: [id number] Date of Birth: Apr 01, 1961 Referring Provider: Dr. Darrin Nipper  Encounter Date: 08/20/2016      OT End of Session - 08/20/16 1556    Visit Number 9   Number of Visits 14   Date for OT Re-Evaluation 08/26/16   Authorization Type WC - Case manager Gretchen Portela (fax # [phone]) - only approved 6 visits   ( 8 additional visits approved though 09/14/16)   Authorization Time Period 06/17/16 - 09/14/16   Authorization - Visit Number 9   Authorization - Number of Visits 14   OT Start Time 1530   OT Stop Time 1610   OT Time Calculation (min) 40 min   Activity Tolerance Patient tolerated treatment well      Past Medical History:  Diagnosis Date  . Bleeding in head following injury with prolonged (more than 24 hours) loss of consciousness with return to pre-existing conscious level (HCC)   . Hypertension     No past surgical history on file.  There were no vitals filed for this visit.      Subjective Assessment - 08/20/16 1533    Subjective  Pt does report slight headache 4/10 when going outside but resolves when coming in   Pertinent History SDH 05/26/16, HTN   Currently in Pain? No/denies                      OT Treatments/Exercises (OP) - 08/20/16 0001      Shoulder Exercises: ROM/Strengthening   UBE (Upper Arm Bike) x 10 min. Level 8 for UE strength/endurance     Pt lifting and carrying 40 # box to waist level. Lifting floor to tabletop x 5, then walking over obstacles and negotiating through narrow spaces w/ 40 # box. Pt then lifting 25# box to overhead shelf from floor x 5 reps. BP = 142/90. After UBE, BP = 143/90. Pt then ambulating looking for items in the environment while tossing ball in Rt hand, and Lt hand  (alternating): pt found 7/11 items (64% accuracy).                 OT Long Term Goals - 08/20/16 1624      OT LONG TERM GOAL #1   Title Independent with HEP for UE strength/endurance (due 08/14/16)    Time 8   Period Weeks   Status Achieved     OT LONG TERM GOAL #2   Title Pt to verbalize understanding of memory compensatory strategies for medication management and financial management   Time 8   Period Weeks   Status Achieved     OT LONG TERM GOAL #3   Title Pt to return to simple IADL tasks including: taking out trash, yardwork   Time 8   Period Weeks   Status Achieved  with modifications for yardwork (taking over multiple days and/or frequent rest breaks     OT LONG TERM GOAL #4   Title Pt to demo divided attention b/t environmental scanning and physical tasks @ 90% accuracy or greater in prep for driving   Time 8   Period Weeks   Status On-going  07/27/16: approx 85%     OT LONG TERM GOAL #5   Title Pt to lift 50 lbs BUE's from floor to waist height and  25 lbs overhead in prep for return to work    Time 8   Period Weeks   Status Partially Met  08/20/16: Met for overhead lifting               Plan - 08/20/16 1558    Clinical Impression Statement Pt making progress towards LTG #5. Pt met LTG #3. Anticipate d/c end of next week   Rehab Potential Good   OT Frequency 2x / week   OT Duration 4 weeks   OT Treatment/Interventions Self-care/ADL training;DME and/or AE instruction;Patient/family education;Therapeutic exercises;Therapeutic activities;Neuromuscular education;Functional Mobility Training;Passive range of motion;Cognitive remediation/compensation;Manual Therapy;Energy conservation;Visual/perceptual remediation/compensation   Plan anticipate d/c end of next week, continue with unmet goals   Consulted and Agree with Plan of Care Patient      Patient will benefit from skilled therapeutic intervention in order to improve the following deficits and  impairments:  Decreased endurance, Decreased balance, Decreased knowledge of use of DME, Impaired UE functional use, Decreased mobility, Decreased cognition, Decreased strength, Decreased coordination, Decreased knowledge of precautions, Impaired vision/preception  Visit Diagnosis: Muscle weakness (generalized)  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   Carey Bullocks, OTR/L 08/20/2016, 4:25 PM  [place] [address] [place], [place], [address] Phone: [phone]   Fax:  [phone]  Name: Frank Valdez MRN: [id number] Date of Birth: 12-Dec-1960

## 2016-08-25 ENCOUNTER — Ambulatory Visit: Payer: Worker's Compensation | Attending: Psychiatry | Admitting: Physical Therapy

## 2016-08-25 ENCOUNTER — Ambulatory Visit: Payer: Worker's Compensation | Attending: Psychiatry | Admitting: Occupational Therapy

## 2016-08-25 DIAGNOSIS — H8112 Benign paroxysmal vertigo, left ear: Secondary | ICD-10-CM | POA: Diagnosis not present

## 2016-08-25 DIAGNOSIS — I69018 Other symptoms and signs involving cognitive functions following nontraumatic subarachnoid hemorrhage: Secondary | ICD-10-CM

## 2016-08-25 DIAGNOSIS — M6281 Muscle weakness (generalized): Secondary | ICD-10-CM | POA: Diagnosis present

## 2016-08-25 NOTE — Therapy (Signed)
[facility] [address] [place], [place], [address] Phone: [phone]   Fax:  [phone]  Physical Therapy Treatment  Patient Details  Name: Frank Valdez MRN: [id number] Date of Birth: 08/21/1960 Referring Provider: Clarice Pole , MD  Encounter Date: 08/25/2016      PT End of Session - 08/26/16 2137    Visit Number 6   Number of Visits 6   Date for PT Re-Evaluation 08/17/16   Authorization Type Worker's Comp   Authorization - Visit Number 6   Authorization - Number of Visits 6   PT Start Time 0930   PT Stop Time 1013   PT Time Calculation (min) 43 min      Past Medical History:  Diagnosis Date  . Bleeding in head following injury with prolonged (more than 24 hours) loss of consciousness with return to pre-existing conscious level ([place])   . Hypertension     No past surgical history on file.  There were no vitals filed for this visit.      Subjective Assessment - 08/26/16 2122    Subjective Pt reports he has some vertigo every day - feels it when he lies down to go to bed and then when he gets up in the am   Patient is accompained by: Family member   Pertinent History SDH due to fall   Patient Stated Goals resolve the vertigo   Currently in Pain? No/denies                 NeuroRe-ed:   sit to Lt sidelying - pt reports feeling dizzy - 10- 15 sec duration - feels a spinning sensation in head  -- no nystagmus  Same sensation with return to upright  - 2-3 sec duration  Sit to Rt sidelying- no c/o dizziness; return to sitting - no c/o dizziness      Vestibular Treatment/Exercise - 08/26/16 0001      Vestibular Treatment/Exercise   Habituation Exercises Nestor Lewandowsky    Left Dix-Hallpike test (+) for c/o vertigo (approx.4-5 secs duration); no nystagmus observed in room light Pt reports dizziness with return to upright seated position - pt describes as spinning sensation (not  light-headedness)  Right Dix-Hallpike (-) for c/o vertigo and nystagmus  2nd rep of Lt Dix-Hallpike test for comparison to 1st rep - same intensity reported as on 1st rep; same intensity of dizziness with return to seated position     Looking up to ceiling - no c/o symptoms with this movement   DVA - 1 line difference (WNL's) pt read line 5 (WNL's for VOR/gaze stabilization) SVA - 20/30 (line 6)   Self Care:  DHI 38% (self perceived disability rating scale)      PT Long Term Goals - 08/26/16 2138      PT LONG TERM GOAL #1   Title Pt will have a (-) Rt Dix-Hallpike test to indicate resolution of Rt BPPV.  07-17-16   Baseline Rt Dix-Hallpike test is (-) for nystagmus but pt subjectively reports vertigo in test position - 08-25-16   Status Partially Met     PT LONG TERM GOAL #2   Title Pt will report no vertigo with bed mobility or with transitional movements.  07-17-16   Baseline pt reports he has vertigo with lying down at night and with sitting up in the am's - 08-25-16   Status Not Met     PT LONG TERM GOAL #3   Title Independent in HEP  for habituation or self treatment as needed for re-occurrence of BPPV.  07-17-16   Baseline met 08-25-16   Status Achieved               Plan - 08/26/16 2139    Clinical Impression Statement Pt has met LTG # 1 partially as pt has no nystagmus with Lt Dix-Hallpike test but subjectively reports vertigo in test position.  LTG #2 not met as pt continues to report having vertigo when he lies down at night.  LTG #3 met.    Rehab Potential Good   PT Frequency 1x / week   PT Duration 6 weeks   PT Treatment/Interventions ADLs/Self Care Home Management;Canalith Repostioning;Therapeutic activities;Therapeutic exercise;Balance training;Neuromuscular re-education;Patient/family education;Vestibular   PT Next Visit Plan N/A - D/C   PT Home Exercise Plan Brandt-Daroff exercises prn   Consulted and Agree with Plan of Care Patient      Patient will  benefit from skilled therapeutic intervention in order to improve the following deficits and impairments:  Dizziness, Decreased balance  Visit Diagnosis: BPPV (benign paroxysmal positional vertigo), left     Problem List There are no active problems to display for this patient.  PHYSICAL THERAPY DISCHARGE SUMMARY  Visits from Start of Care: 6  Current functional level related to goals / functional outcomes: See above for progress towards goals   Remaining deficits: Pt continues to c/o vertigo with lying down and with other movements - no nystagmus observed   Education / Equipment: Pt has been instructed in Brandt-Daroff exercises for habituation Plan: Patient agrees to discharge.  Patient goals were partially met. Patient is being discharged due to meeting the stated rehab goals.  ?????          Alda Lea, [place] 08/26/2016, 9:47 PM  [facility] [address] [place], [place], [address] Phone: [phone]   Fax:  [phone]  Name: Hobie Kohles MRN: [id number] Date of Birth: [birth year]
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
## 2016-08-27 ENCOUNTER — Ambulatory Visit: Payer: Worker's Compensation | Admitting: Occupational Therapy

## 2016-08-27 DIAGNOSIS — M6281 Muscle weakness (generalized): Secondary | ICD-10-CM

## 2016-08-27 DIAGNOSIS — I69018 Other symptoms and signs involving cognitive functions following nontraumatic subarachnoid hemorrhage: Secondary | ICD-10-CM

## 2016-08-27 NOTE — Therapy (Signed)
[facility] [address] [place], [place], [address] Phone: [phone]   Fax:  [phone]  Occupational Therapy Treatment  Patient Details  Name: Frank Valdez MRN: [id number] Date of Birth: April 09, 1961 Referring Provider: Dr. Clarice Pole  Encounter Date: 08/27/2016      OT End of Session - 08/27/16 1601    Visit Number 11   Number of Visits 14   Authorization Type [facility] - Case manager Cliffton Asters (fax # [phone]) - only approved 6 visits   ( 8 additional visits approved though 09/14/16)   Authorization Time Period 06/17/16 - 09/14/16   Authorization - Visit Number 11   Authorization - Number of Visits 14   OT Start Time 7412   OT Stop Time 8786   OT Time Calculation (min) 35 min   Activity Tolerance Patient tolerated treatment well      Past Medical History:  Diagnosis Date  . Bleeding in head following injury with prolonged (more than 24 hours) loss of consciousness with return to pre-existing conscious level ([place])   . Hypertension     No past surgical history on file.  There were no vitals filed for this visit.      Subjective Assessment - 08/27/16 1533    Subjective  I saw the ENT and he said my loss of smell may not get better   Pertinent History SDH 05/26/16, HTN   Currently in Pain? No/denies                      OT Treatments/Exercises (OP) - 08/27/16 0001      Shoulder Exercises: ROM/Strengthening   UBE (Upper Arm Bike) x 10 min. Level 10 for UE strength/endurance (5 min. forwards, 5 min. backwards).    Other ROM/Strengthening Exercises Pt lifting 50 # box from floor to waist level x 5 reps, then carrying 50# box 75 ft., then lifting from floor to waist level 2nd set x 5 reps. BP 138/90     Visual/Perceptual Exercises   Scanning - Environmental Scanning for items in environment while ambulating and performing physical task (tossing ball) Rt and Lt hand. Pt found 12/14 items first  trial (86% accuracy)                      OT Long Term Goals - 08/27/16 1602      OT LONG TERM GOAL #1   Title Independent with HEP for UE strength/endurance (due 08/14/16)    Time 8   Period Weeks   Status Achieved     OT LONG TERM GOAL #2   Title Pt to verbalize understanding of memory compensatory strategies for medication management and financial management   Time 8   Period Weeks   Status Achieved     OT LONG TERM GOAL #3   Title Pt to return to simple IADL tasks including: taking out trash, yardwork   Time 8   Period Weeks   Status Achieved  with modifications for yardwork (taking over multiple days and/or frequent rest breaks     OT LONG TERM GOAL #4   Title Pt to demo divided attention b/t environmental scanning and physical tasks @ 90% accuracy or greater in prep for driving   Time 8   Period Weeks   Status Not Met  As of 08/27/16: between 67-86% accuracy     OT LONG TERM GOAL #5   Title Pt to lift 50 lbs BUE's from  floor to waist height and 25 lbs overhead in prep for return to work    Time 8   Period Weeks   Status Achieved                 Plan - 08/27/16 1602    Clinical Impression Statement Pt met all goals except divided attention goal. Pt has progressed in endurance and strength   Rehab Potential Good   OT Frequency 2x / week   OT Duration 4 weeks   OT Treatment/Interventions Self-care/ADL training;DME and/or AE instruction;Patient/family education;Therapeutic exercises;Therapeutic activities;Neuromuscular education;Functional Mobility Training;Passive range of motion;Cognitive remediation/compensation;Manual Therapy;Energy conservation;Visual/perceptual remediation/compensation   Plan D/C O.T.   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   OCCUPATIONAL THERAPY DISCHARGE SUMMARY  Visits from Start of Care: 11  Current functional level related to goals / functional outcomes: SEE ABOVE   Remaining deficits: Strength and endurance Divided attention and environmental scanning Dizziness   Education / Equipment: HEP's, recommendations  Plan: Patient agrees to discharge.  Patient goals were met. Patient is being discharged due to meeting the stated rehab goals.    **Recommend pt have driving evaluation prior to return to driving. Pt currently seeing speech therapy - defer to them re: cognitive deficits that may impede return to work?????          Carey Bullocks, OTR/L 08/27/2016, 4:05 PM  [facility] [address] [place], [place], [address] Phone: [phone]   Fax:  [phone]  Name: Frank Valdez MRN: [id number] Date of Birth: June 16, 1960

## 2016-08-31 ENCOUNTER — Ambulatory Visit: Payer: Worker's Compensation

## 2016-09-01 ENCOUNTER — Ambulatory Visit: Payer: Worker's Compensation

## 2016-09-01 DIAGNOSIS — M6281 Muscle weakness (generalized): Secondary | ICD-10-CM | POA: Diagnosis not present

## 2016-09-01 DIAGNOSIS — R41841 Cognitive communication deficit: Secondary | ICD-10-CM
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
## 2016-09-01 NOTE — Patient Instructions (Addendum)
   Make an appointment with your primary care MD.

## 2016-09-08 ENCOUNTER — Encounter: Payer: 59 | Admitting: Occupational Therapy

## 2016-09-15 ENCOUNTER — Ambulatory Visit: Payer: 59 | Attending: Psychiatry

## 2016-09-15 DIAGNOSIS — R41841 Cognitive communication deficit: Secondary | ICD-10-CM | POA: Diagnosis not present

## 2016-09-15 NOTE — Patient Instructions (Addendum)
Dr. Luberta Robertsonrowell [facility] [phone]  ===================================      SLP Short Term Goals - 07/29/16 1429      SLP SHORT TERM GOAL #1   Title Pt will complete Level 2 Executive Function (thought organization, problem solving, sequencing, planning, and reasoning) tasks with 90% accuracy given mod cues   Status Achieved     SLP SHORT TERM GOAL #2   Title Pt will identify and implement compensatory strategy for functional recall with mod cues for use outside of therapy   Status Achieved            SLP Long Term Goals - 09/15/16 0911      SLP LONG TERM GOAL #1   Title Pt will complete Level 3 Executive Function (thought organization, problem solving, sequencing, planning, and reasoning) tasks with 90% accuracy given min cues   Status Achieved     SLP LONG TERM GOAL #2   Title Pt will identify and implement compensatory strategy for functional recall with independent use outside of therapy x 2 sessions   Status Achieved     SLP LONG TERM GOAL #3   Title pt will complete two simple- mod complex cognitive linguistic tasks at one time with 90% accuracy on each task   Status Deferred  pt reports attention is at baseline

## 2016-09-15 NOTE — Therapy (Signed)
[facility] [address] [place], [place], [address] Phone: [phone]   Fax:  [phone]  Speech Language Pathology Treatment  Patient Details  Name: Frank Valdez MRN: [id number] Date of Birth: [birth year] Referring Provider: Clarice Pole M.D. (released pt early May 2018), Paruchuri, Eduardo Osier, MD (PCP)  Encounter Date: 09/15/2016      End of Session - 09/15/16 1056    Visit Number 10   Number of Visits 10   Date for SLP Re-Evaluation 09/14/16  unsure of end date - none noted on original documentation of auth   Authorization - Visit Number 10   Authorization - Number of Visits 10   SLP Start Time 0848   SLP Stop Time  0926   SLP Time Calculation (min) 38 min   Activity Tolerance Patient tolerated treatment well      Past Medical History:  Diagnosis Date  . Bleeding in head following injury with prolonged (more than 24 hours) loss of consciousness with return to pre-existing conscious level ([place])   . Hypertension     No past surgical history on file.  There were no vitals filed for this visit.      Subjective Assessment - 09/15/16 0903    Subjective Pt reports he is still of the mindset his cognitive function is at baseline. Cont to c/o vertigo, loss of smell/taste.   Currently in Pain? No/denies               ADULT SLP TREATMENT - 09/15/16 0904      General Information   Behavior/Cognition Alert;Cooperative;Pleasant mood     Treatment Provided   Treatment provided Cognitive-Linquistic     Cognitive-Linquistic Treatment   Treatment focused on Cognition   Skilled Treatment Pt reports that things are working with his memory compensation. Pt using planner to recall appointments and has not missed an appointment since beginning to use the planner. He reports usage of planner in other situations as well - i.e. to do list. Pt showed good executive function in nature and timing of how he would  contact medical professionals to cont his medical care from this point onward.      Assessment / Recommendations / Plan   Plan Discharge SLP treatment due to (comment)  goals met            SLP Short Term Goals - 07/29/16 1429      SLP SHORT TERM GOAL #1   Title Pt will complete Level 2 Executive Function (thought organization, problem solving, sequencing, planning, and reasoning) tasks with 90% accuracy given mod cues   Status Achieved     SLP SHORT TERM GOAL #2   Title Pt will identify and implement compensatory strategy for functional recall with mod cues for use outside of therapy   Status Achieved          SLP Long Term Goals - 09/15/16 0911      SLP LONG TERM GOAL #1   Title Pt will complete Level 3 Executive Function (thought organization, problem solving, sequencing, planning, and reasoning) tasks with 90% accuracy given min cues   Status Achieved     SLP LONG TERM GOAL #2   Title Pt will identify and implement compensatory strategy for functional recall with independent use outside of therapy x 2 sessions   Status Achieved     SLP LONG TERM GOAL #3   Title pt will complete two simple- mod complex cognitive linguistic tasks at one time with  90% accuracy on each task   Status Deferred  pt reports attention is at baseline          Plan - 09/15/16 1058    Clinical Impression Statement SLP looked again at [facility] and for ST, no definite end date is noted, so it is assumed pt's auth extends through to his visit today. Pt reports his memory system is working and his attention and other cognitive communication skills are at baseline. His major concern cont to be vertigo, which he stated PT told him nothing more could really be done presently. Pt also has concerns about stamina despite results of FCE. He demo'd appropriate executive function today in speaking about how to contact other medical professionals and when to do so (organization and reasoning). Pt will be d/c'd at  this time due to abilities WNL and pt compensating [facility] for memory deficit.    Treatment/Interventions SLP instruction and feedback;Compensatory strategies;Functional tasks;Cognitive reorganization;Compensatory techniques;Multimodal communcation approach;Patient/family education   Potential to Achieve Goals Good   Potential Considerations Ability to learn/carryover information   Consulted and Agree with Plan of Care Patient;Family member/caregiver      Patient will benefit from skilled therapeutic intervention in order to improve the following deficits and impairments:   Cognitive communication deficit   SPEECH THERAPY DISCHARGE SUMMARY  Visits from Start of Care: 10  Current functional level related to goals / functional outcomes: See above for summary of progress and goals.   Remaining deficits: Memory deficit which pt is compensating for with planner in which he writes appointments, to do list, etc.   Education / Equipment: Compensations for memory  Plan: Patient agrees to discharge.  Patient goals were met. Patient is being discharged due to meeting the stated rehab goals.  ?????       Problem List There are no active problems to display for this patient.   [facility] ,[place], [place]  09/15/2016, 11:02 AM  [facility] [address] [place], [place], [address] Phone: [phone]   Fax:  [phone]   Name: Filipe Greathouse MRN: [id number] Date of Birth: 04-15-1961

## 2018-11-10 IMAGING — CT CT VENOGRAM HEAD
3 of 4 series · 18 of 47 positions shown · IV contrast (APPLIED)
Comparison: 06/01/2016 CT head appear

CLINICAL DATA: 56 y/o M; history of fall with intracranial
hemorrhage presenting with severe frontal headache after discharge 2
days ago.

EXAM:
CT VENOGRAM HEAD
TECHNIQUE: CT venogram of the head was performed with scanning from skullbase
to vertex after intravenous contrast administration.
CONTRAST:  100mL [id number] IOPAMIDOL ([id number]) INJECTION 61%

[Series 11: ax thin · axial · 0.51mm/px · z∈[-327,-173]mm · 12 of 585 slices shown]
[im 47/585  brain]
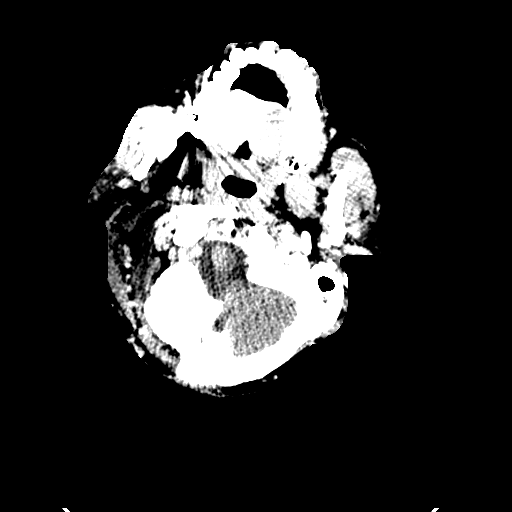
[im 94/585  bone]
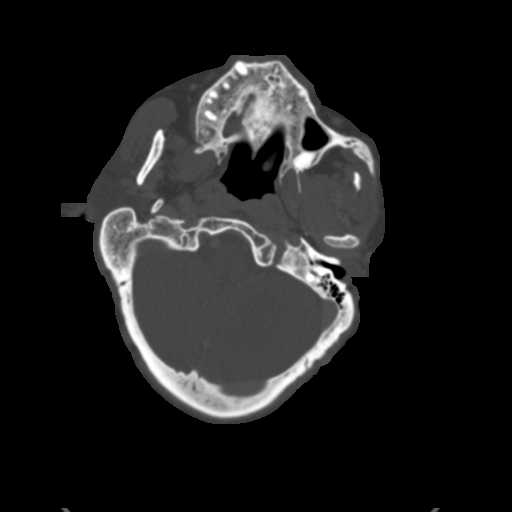
[im 141/585  brain]
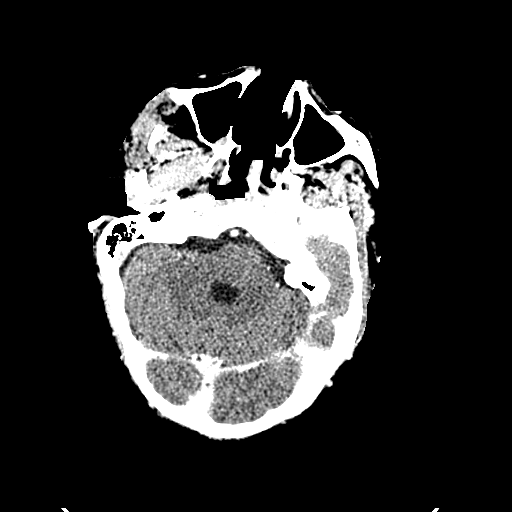
[im 187/585  bone]
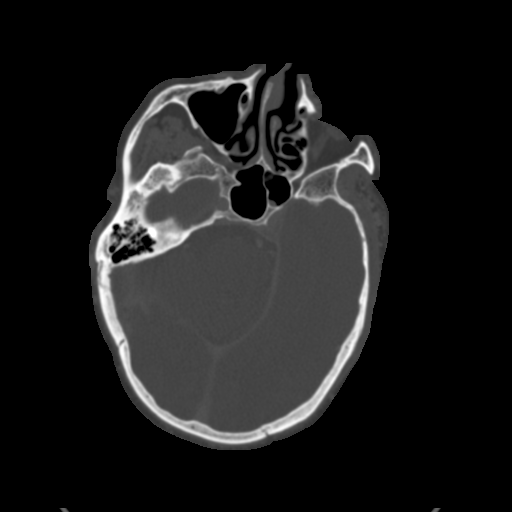
[im 234/585  brain]
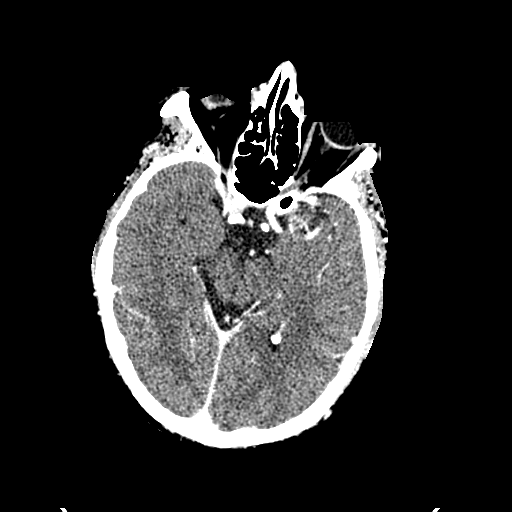
[im 281/585  bone]
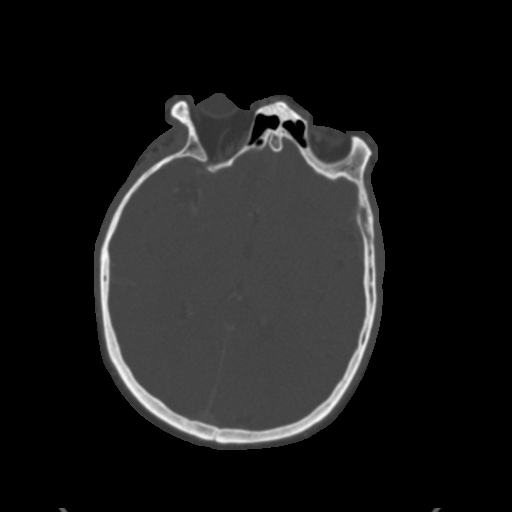
[im 328/585  brain]
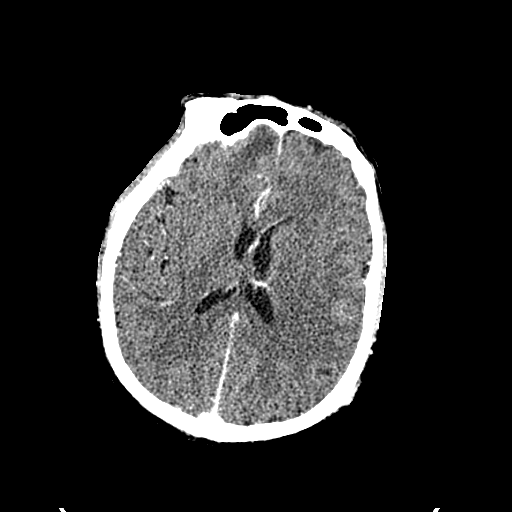
[im 374/585  bone]
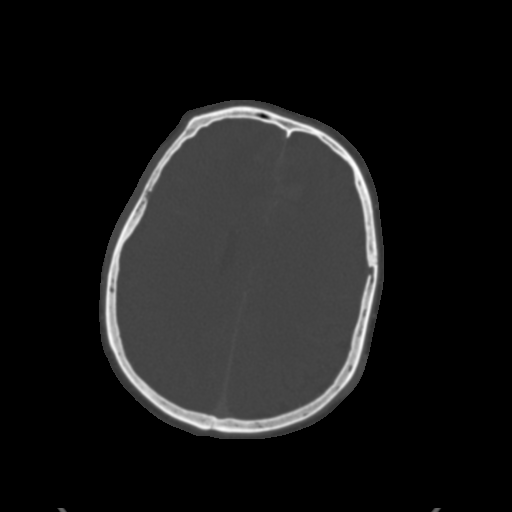
[im 421/585  brain]
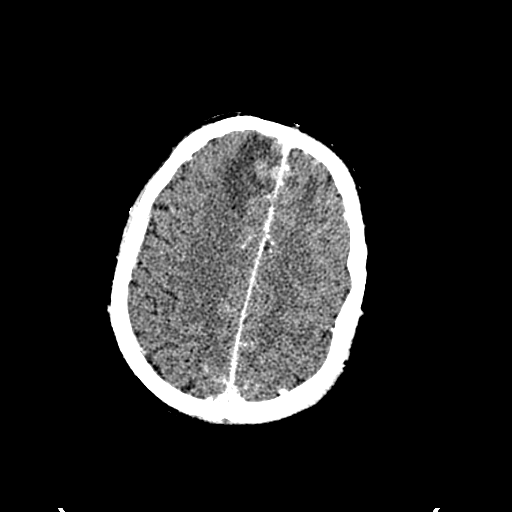
[im 468/585  bone]
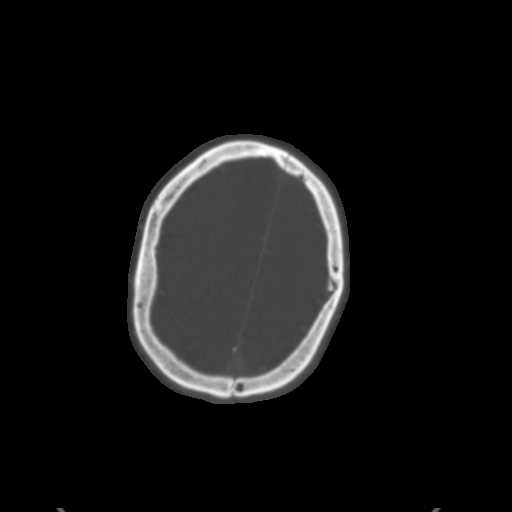
[im 514/585  brain]
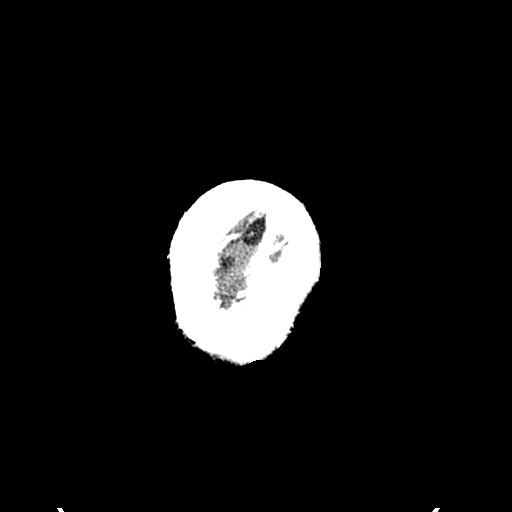
[im 561/585  bone]
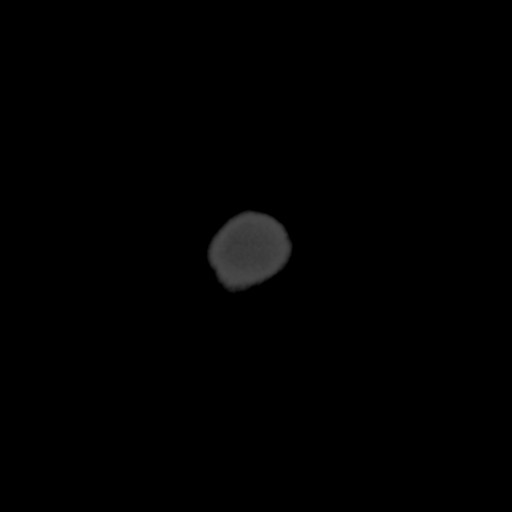

[Series 14: coronal soft · coronal · 0.34mm/px · 3 of 106 slices shown]
[im 36/106  brain]
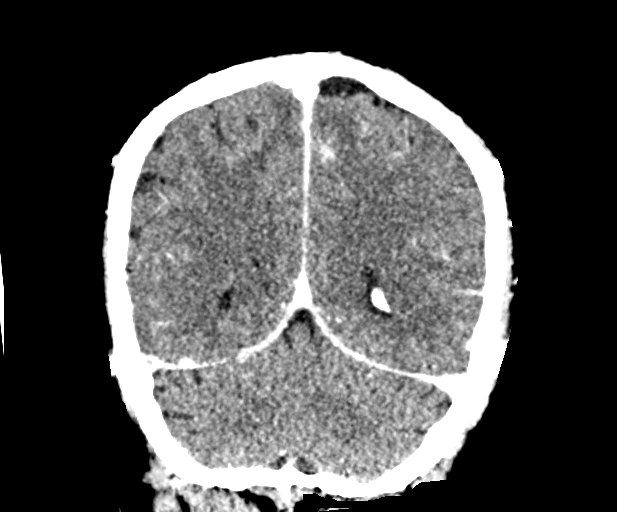
[im 47/106  brain]
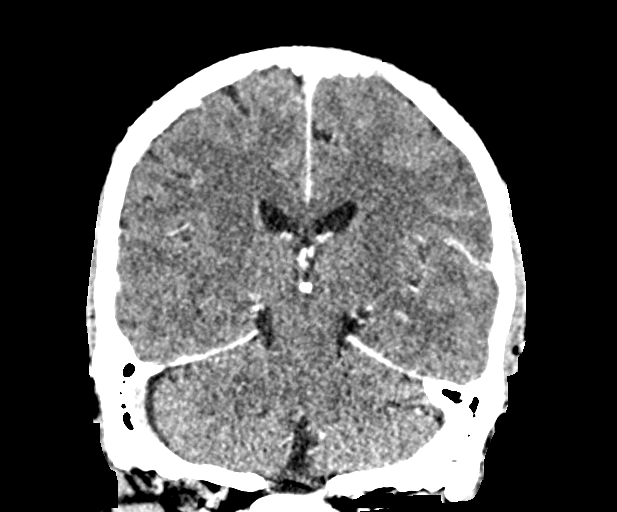
[im 59/106  brain]
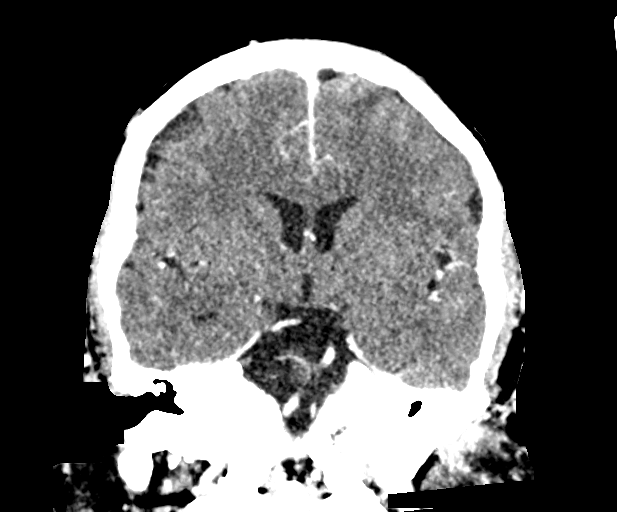

[Series 15: sag soft · sagittal · 0.34mm/px · 3 of 67 slices shown]
[im 25/67  brain]
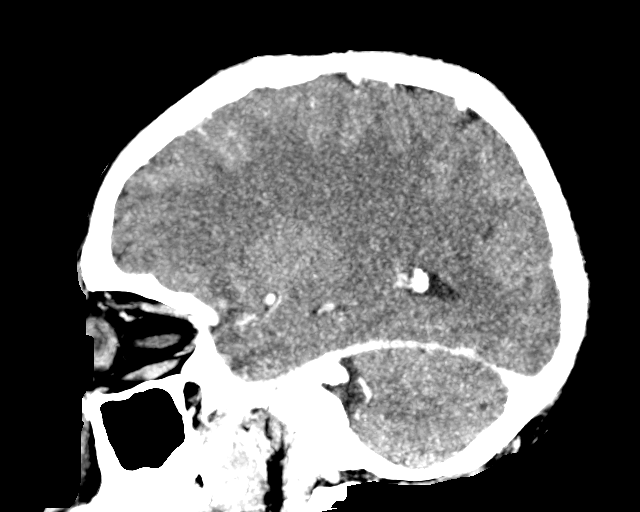
[im 34/67  brain]
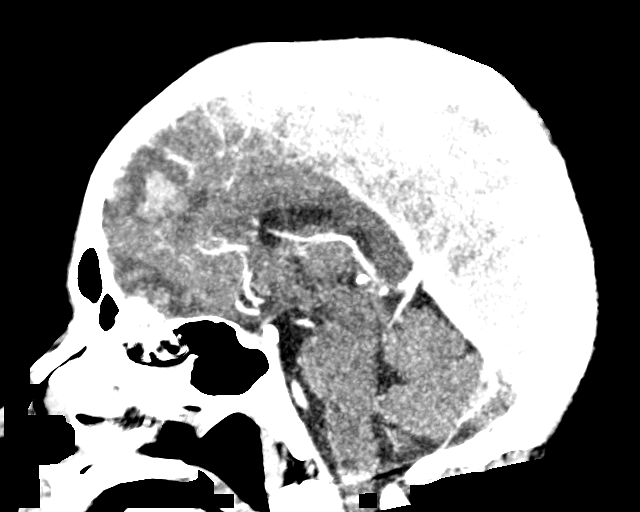
[im 42/67  brain]
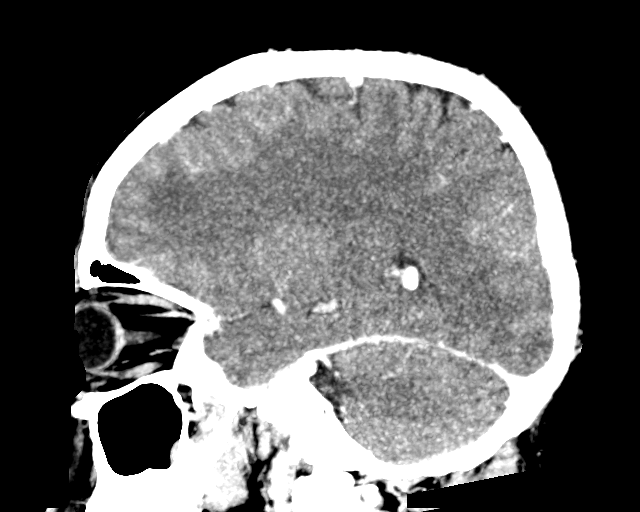

[18 of 47 positions shown; findings below may reference images not displayed]

FINDINGS: Small nonocclusive filling defect within the posterior aspect of the
superior sagittal sinus subjacent to skull fracture (Series 10,
image 70) probably representing a small thrombosis. Filling defect
within the right transverse and right sigmoid sinus with minimal
peripheral enhancement compatible with a occlusive/near occlusive
thrombus.

Otherwise the superior sagittal sinus, left transverse sinus to
upper internal jugular vein, straight sinus, internal cerebral
veins, basal veins of [place], and large cortical veins are
patent.

Frontal hemorrhagic cortical contusions, subarachnoid hemorrhage,
and hemorrhage along the falx and tentorium cerebelli is stable.
IMPRESSION: 1. Right transverse and right sigmoid sinus occlusive/near occlusive
thrombosis.
2. Small filling defect within the posterior aspect of superior
sagittal sinus subjacent to skull fracture, probably a nonocclusive
thrombus.
3. Frontal hemorrhagic cortical contusions, subarachnoid hemorrhage,
and hemorrhage along the falx and tentorium cerebelli is stable.
These results were called by telephone at the time of interpretation
on 06/01/2016 at [DATE] to Dr. BAO VOLLMER , who verbally
acknowledged these results.

By: Aldevinas Tabassum M.D.

## 2018-11-10 IMAGING — CT CT HEAD W/O CM
3 series · 14 of 47 positions shown, 16 images · non-contrast
Comparison: 05/30/2016 and 05/26/2016 CT of head.

CLINICAL DATA: 56 y/o M; intracranial hemorrhage for follow-up. New
headaches.

EXAM:
CT HEAD WITHOUT CONTRAST
TECHNIQUE: Contiguous axial images were obtained from the base of the skull
through the vertex without intravenous contrast.

[Series 2: head wo · axial · 0.45mm/px · z∈[-437,-297]mm · 8 of 34 slices shown, 10 images]
[im 3/34  brain]
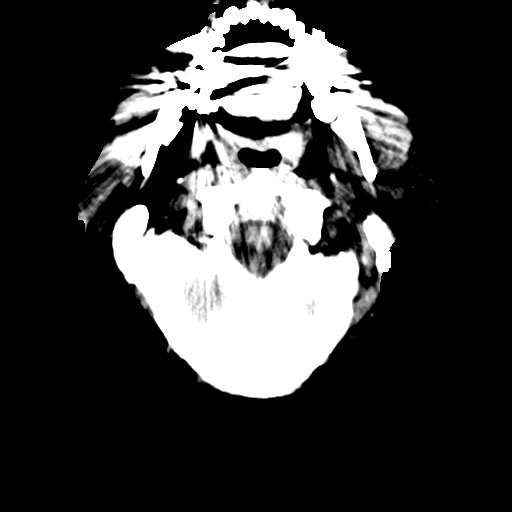
[im 3/34  bone]
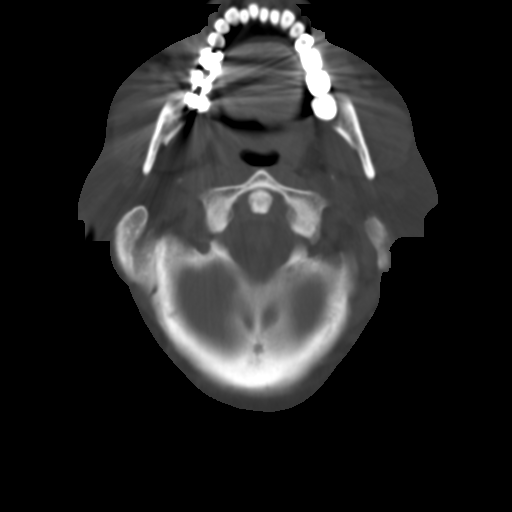
[im 7/34  brain]
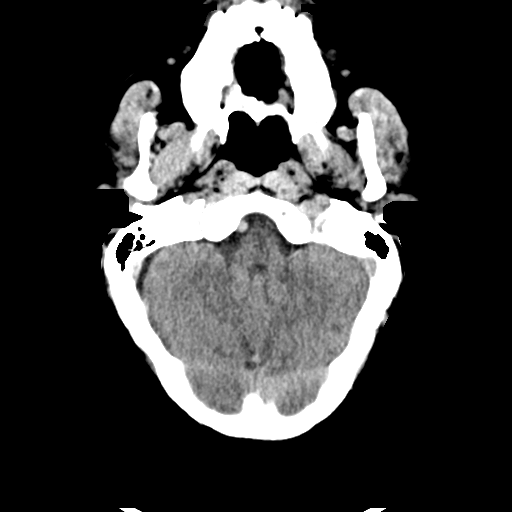
[im 11/34  brain]
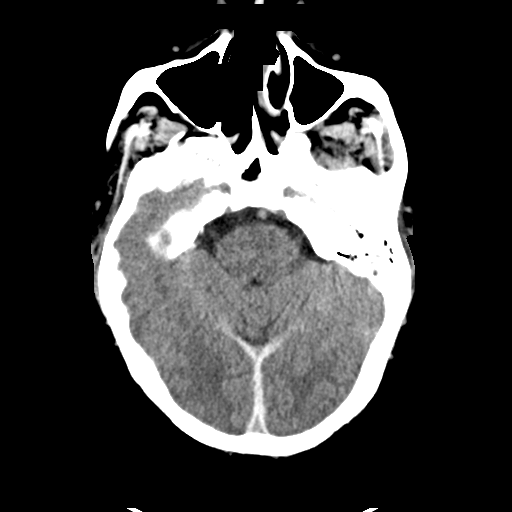
[im 15/34  brain]
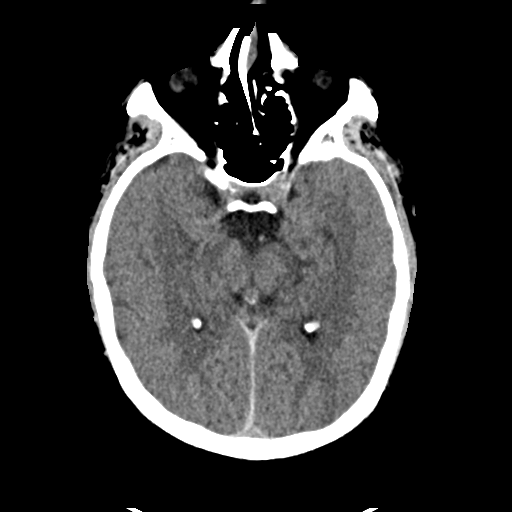
[im 19/34  brain]
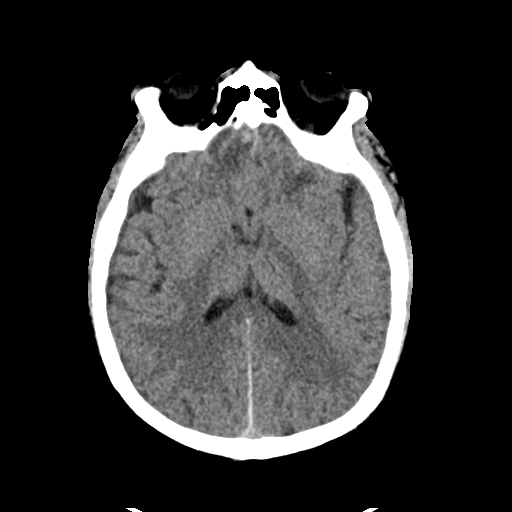
[im 19/34  bone]
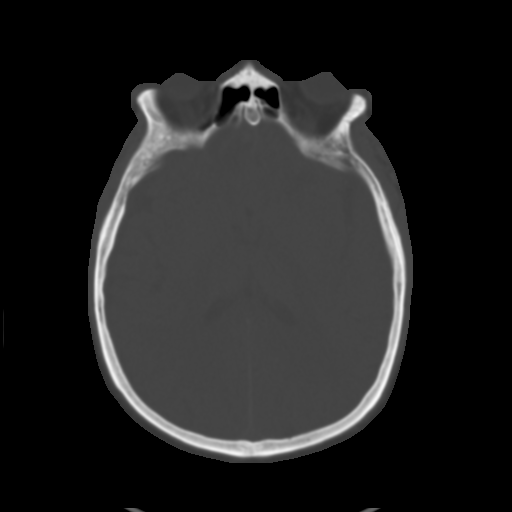
[im 23/34  brain]
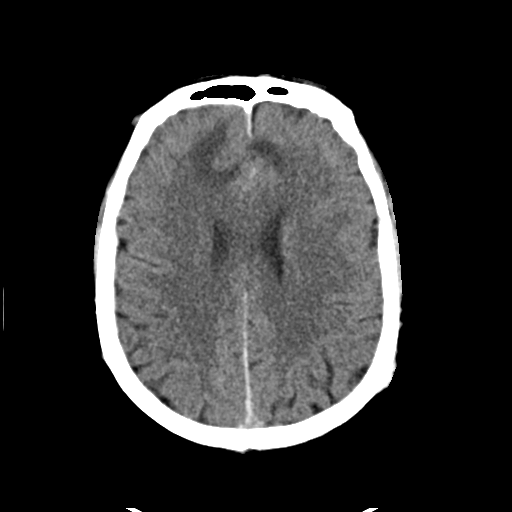
[im 27/34  brain]
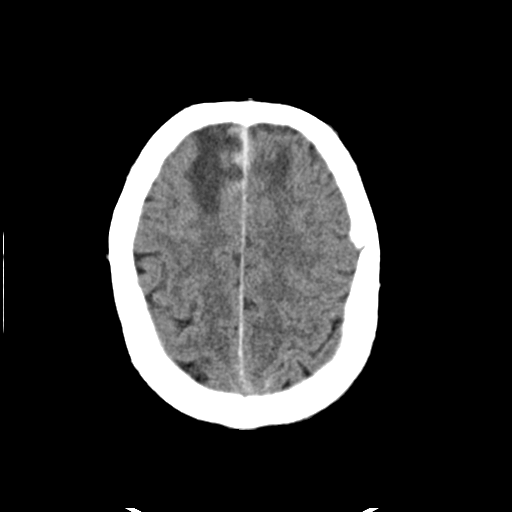
[im 31/34  brain]
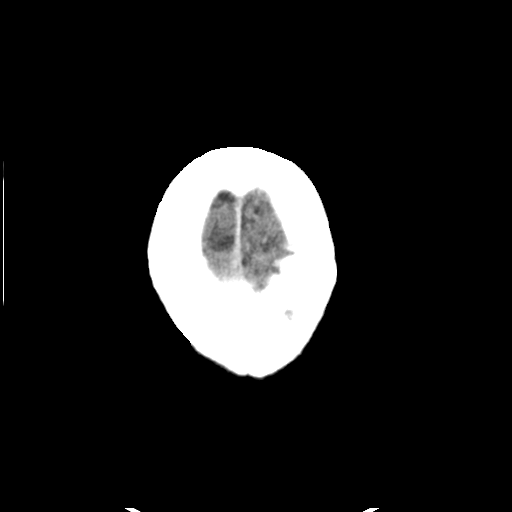

[Series 4: coronal soft · coronal · 0.34mm/px · 3 of 74 slices shown]
[im 25/74  brain]
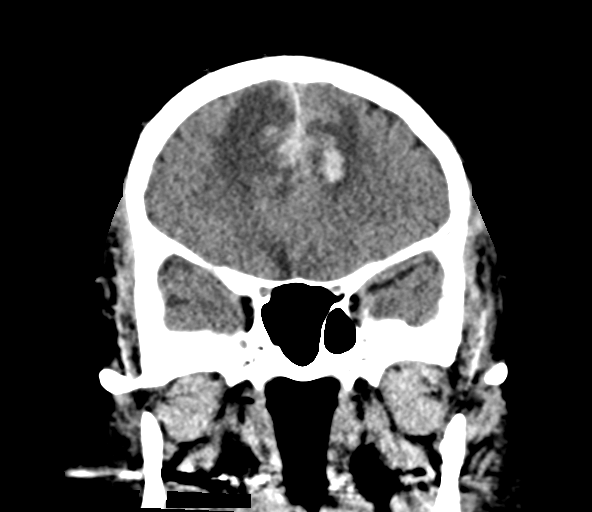
[im 33/74  brain]
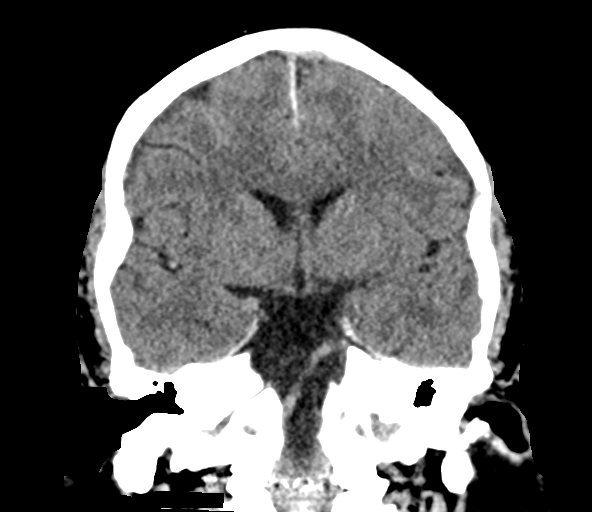
[im 41/74  brain]
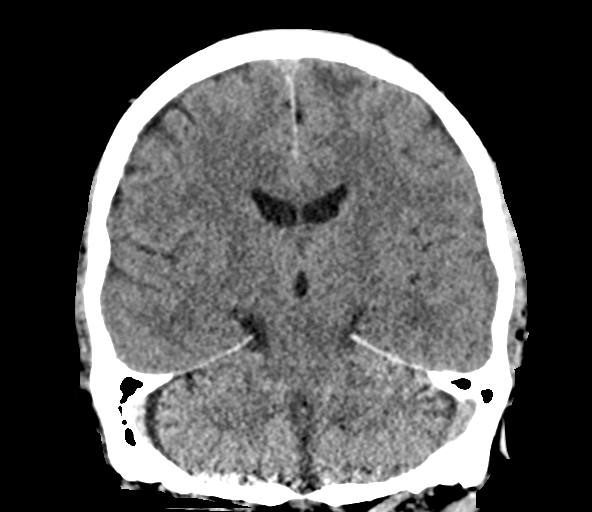

[Series 5: sag soft · sagittal · 0.34mm/px · 3 of 67 slices shown]
[im 23/67  brain]
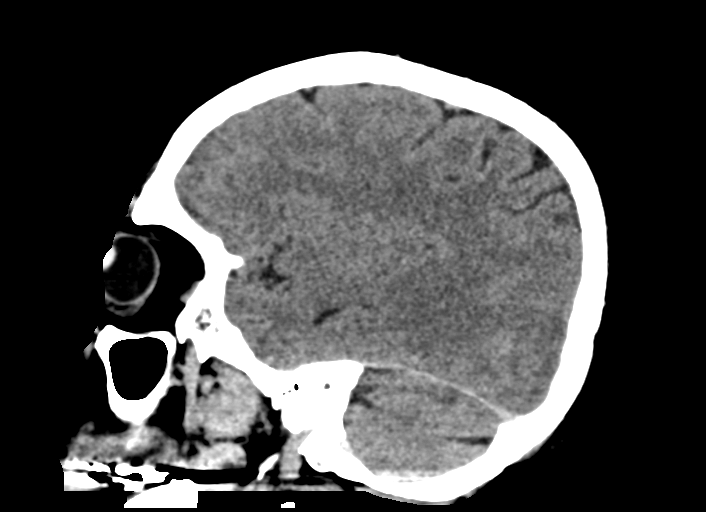
[im 34/67  brain]
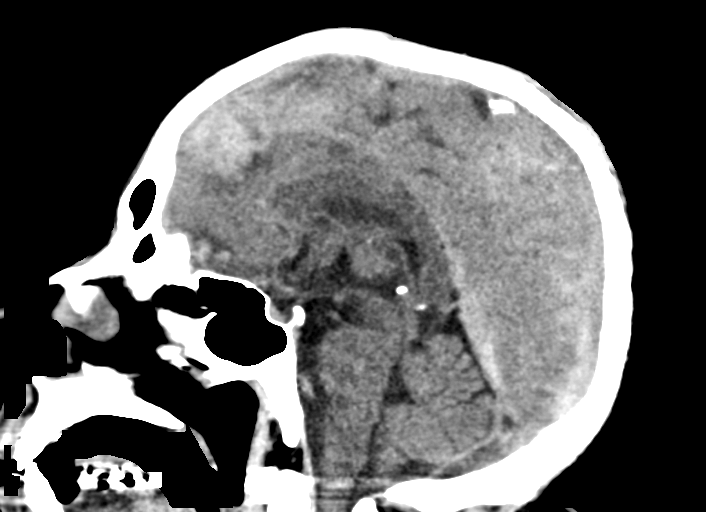
[im 45/67  brain]
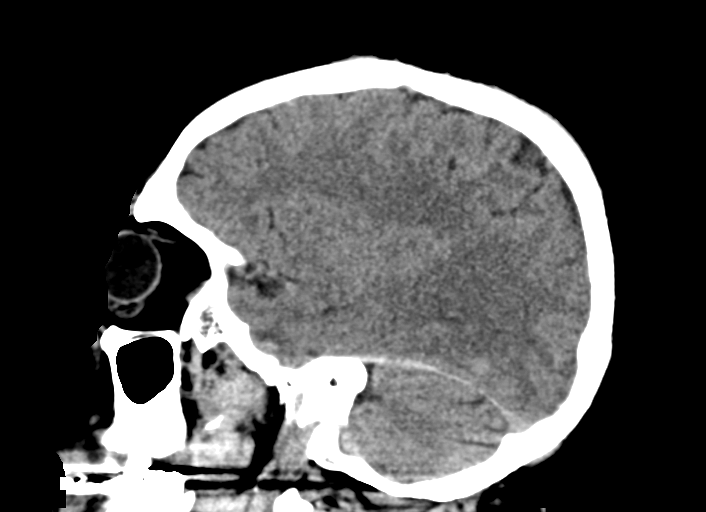

[14 of 47 positions shown; findings below may reference images not displayed]

FINDINGS: Brain: Stable hemorrhagic contusions of the frontal lobes. All
volume of subarachnoid hemorrhage over the frontal lobes is stable
and there is new subarachnoid hemorrhage in the parietal areas that
is probably due to redistribution of blood products. Stable small
volume of extra-axial hemorrhage along the falx and tentorium
cerebelli bilaterally. No evidence for new acute brain parenchymal
hemorrhage, large territory infarct, or significant mass effect.

Vascular: Decreased attenuation of the superior sagittal sinus and
question increased attenuation in the right transverse sinus when
compared with 05/26/2016 CT of head.

Skull: Stable nondisplaced skull fracture traversing the frontal
bone anteriorly and with minimal diastases of sagittal suture.

Sinuses/Orbits: No acute finding.

Other: None.
IMPRESSION: 1. Decreased attenuation of the superior sagittal sinus and question
increased attenuation of the right transverse sinus, CT or MR
venogram recommended to evaluate for thrombosis.
2. Stable bifrontal hemorrhagic contusions, subarachnoid hemorrhage,
and hemorrhage along the falx/tentorium cerebelli.
3. New small volume of subarachnoid hemorrhage over the parietal
lobes, probably due to redistribution of blood products.
4. No evidence for new large brain parenchymal infarct, hemorrhage,
or focal mass effect.
5. Stable nondisplaced skull fracture.
These results were called by telephone at the time of interpretation
on 06/01/2016 at [DATE] to Dr. KIKI MICHEL , who verbally
acknowledged these results.

By: Eun Feinstein M.D.
